# Patient Record
Sex: Male | Born: 1975 | Hispanic: Refuse to answer | Marital: Married
Health system: Midwestern US, Community
[De-identification: ages and names within clinical notes are randomized; demographics above are authoritative.]

## PROBLEM LIST (undated history)

## (undated) DIAGNOSIS — N529 Male erectile dysfunction, unspecified: Principal | ICD-10-CM

---

## 2007-10-03 ENCOUNTER — Emergency Department (HOSPITAL_COMMUNITY): Admission: EM | Admit: 2007-10-03 | Discharge: 2007-10-03 | Payer: Self-pay | Admitting: Family Medicine

## 2007-10-03 ENCOUNTER — Emergency Department (HOSPITAL_COMMUNITY): Admission: EM | Admit: 2007-10-03 | Discharge: 2007-10-03 | Payer: Self-pay | Admitting: Emergency Medicine

## 2010-02-04 ENCOUNTER — Encounter: Admission: RE | Admit: 2010-02-04 | Discharge: 2010-02-04 | Payer: Self-pay | Admitting: Family Medicine

## 2012-07-03 ENCOUNTER — Ambulatory Visit (INDEPENDENT_AMBULATORY_CARE_PROVIDER_SITE_OTHER): Payer: BC Managed Care – PPO | Admitting: Emergency Medicine

## 2012-07-03 ENCOUNTER — Ambulatory Visit: Payer: BC Managed Care – PPO

## 2012-07-03 VITALS — BP 110/80 | HR 62 | Temp 98.1°F | Resp 18 | Ht 72.0 in | Wt 231.0 lb

## 2012-07-03 DIAGNOSIS — M25511 Pain in right shoulder: Secondary | ICD-10-CM

## 2012-07-03 DIAGNOSIS — M542 Cervicalgia: Secondary | ICD-10-CM

## 2012-07-03 DIAGNOSIS — M25519 Pain in unspecified shoulder: Secondary | ICD-10-CM

## 2012-07-03 DIAGNOSIS — R21 Rash and other nonspecific skin eruption: Secondary | ICD-10-CM

## 2012-07-03 LAB — POCT SKIN KOH: Skin KOH, POC: NEGATIVE

## 2012-07-03 MED ORDER — MELOXICAM 15 MG PO TABS: 15.0000 mg | ORAL_TABLET | Freq: Every day | ORAL | Status: DC

## 2012-07-03 MED ORDER — BETAMETHASONE DIPROPIONATE AUG 0.05 % EX CREA: TOPICAL_CREAM | Freq: Two times a day (BID) | CUTANEOUS | Status: DC

## 2012-07-03 MED ORDER — CYCLOBENZAPRINE HCL 10 MG PO TABS: 10.0000 mg | ORAL_TABLET | Freq: Three times a day (TID) | ORAL | Status: DC | PRN

## 2012-07-03 NOTE — Progress Notes (Signed)
  Subjective:    Patient ID: Richard Atkinson, male    DOB: 06-01-1975, 37 y.o.   MRN: [id number]  HPI patient enters with pain in the right side of his neck right upper shoulder with pain on lifting his right arm. He denies being short of breath. He denies being sick. He also would like a rash checked on the inside of his ankle he's used Diprolene cream on this in the past with improvement .    Review of Systems     Objective:   Physical Exam there is tenderness right-sided neck and right shoulder. Rotator cuff cuff testing was normal. Deep tendon reflexes were 2+ and symmetrical. Motor strength 5 out of 5. He does have some mild pain with internal/external rotation of the right shoulder.   [facility] reading (PRIMARY) by  Dr. Cleta Alberts There's no fracture seen. Next number normal. This appears to be a musculoskeletal pain.   Results for orders placed in visit on 07/03/12  POCT SKIN KOH      Result Value Range   Skin KOH, POC Negative          Assessment & Plan:    This appears to be a musculoskeletal pain. Will treat with meloxicam and Flexeril. He was given Diprolene cream to apply to his rash on his ankle which has cleared before with steroid creams. I encouraged him that if his lesion does not completely resolve with treatment he returned to clinic so I can biopsy this area.

## 2012-09-28 ENCOUNTER — Ambulatory Visit (INDEPENDENT_AMBULATORY_CARE_PROVIDER_SITE_OTHER): Payer: BC Managed Care – PPO | Admitting: Family Medicine

## 2012-09-28 ENCOUNTER — Ambulatory Visit: Payer: BC Managed Care – PPO

## 2012-09-28 VITALS — BP 120/80 | HR 71 | Temp 97.7°F | Resp 16 | Ht 72.0 in | Wt 232.0 lb

## 2012-09-28 DIAGNOSIS — M545 Low back pain, unspecified: Secondary | ICD-10-CM

## 2012-09-28 DIAGNOSIS — M25569 Pain in unspecified knee: Secondary | ICD-10-CM

## 2012-09-28 DIAGNOSIS — R21 Rash and other nonspecific skin eruption: Secondary | ICD-10-CM

## 2012-09-28 DIAGNOSIS — M25561 Pain in right knee: Secondary | ICD-10-CM

## 2012-09-28 DIAGNOSIS — L309 Dermatitis, unspecified: Secondary | ICD-10-CM

## 2012-09-28 DIAGNOSIS — L259 Unspecified contact dermatitis, unspecified cause: Secondary | ICD-10-CM

## 2012-09-28 MED ORDER — TRAMADOL HCL 50 MG PO TABS: 50.0000 mg | ORAL_TABLET | Freq: Three times a day (TID) | ORAL | Status: DC | PRN

## 2012-09-28 MED ORDER — BETAMETHASONE DIPROPIONATE AUG 0.05 % EX CREA: TOPICAL_CREAM | Freq: Two times a day (BID) | CUTANEOUS | Status: DC

## 2012-09-28 MED ORDER — OXAPROZIN 600 MG PO TABS: ORAL_TABLET | ORAL | Status: DC

## 2012-09-28 MED ORDER — METHOCARBAMOL 750 MG PO TABS: ORAL_TABLET | ORAL | Status: DC

## 2012-09-28 NOTE — Patient Instructions (Addendum)
Take the methocarbamol, muscle relaxant, one in the morning, one in the afternoon, and 2 at bedtime when needed for tightness and pain in back  Take the oxaprazin one twice daily for pain and inflammation of the knee and of the back  Use the cream twice daily on the ankle. If the rash continues to persist we may need to do a biopsy of the rash.

## 2012-09-28 NOTE — Progress Notes (Signed)
Subjective: Patient has been hurting in the left low back since yesterday. Does not know of any injury. He does carry his boys and play with them.  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  His right knee has been bothering him for a month or two. No defined injury there either. When he walks down the stairs he felt a pop.  Objective: Spine is nontender. No tenderness on the right. Mild tenderness on the left paraspinous muscles. No SI tenderness. No urinary symptoms. His right knee has mild crepitus and no effusion. Ligaments seem strong.  Assessment: Right knee pain Left low back pain, muscular Dermatitis of ankle--if the rash persists we may need to do a punch biopsy of it.  Plan: He wanted to refill the Diprolene which helped him some before.  [facility] reading (PRIMARY) by  Dr. Alwyn Ren Normal knee  Assessment: Knee pain, possibly cartilaginous injury. If symptoms persist we'll need to get a specialist to see this.Marland Kitchen

## 2013-06-22 ENCOUNTER — Ambulatory Visit (INDEPENDENT_AMBULATORY_CARE_PROVIDER_SITE_OTHER): Payer: BC Managed Care – PPO | Admitting: Family Medicine

## 2013-06-22 VITALS — BP 106/68 | HR 89 | Temp 97.3°F | Resp 18 | Ht 72.0 in | Wt 226.8 lb

## 2013-06-22 DIAGNOSIS — M545 Low back pain, unspecified: Secondary | ICD-10-CM

## 2013-06-22 DIAGNOSIS — L309 Dermatitis, unspecified: Secondary | ICD-10-CM

## 2013-06-22 DIAGNOSIS — L259 Unspecified contact dermatitis, unspecified cause: Secondary | ICD-10-CM

## 2013-06-22 MED ORDER — METHOCARBAMOL 750 MG PO TABS: ORAL_TABLET | ORAL | Status: DC

## 2013-06-22 MED ORDER — CLOBETASOL PROPIONATE 0.05 % EX OINT: 1.0000 "application " | TOPICAL_OINTMENT | Freq: Two times a day (BID) | CUTANEOUS | Status: DC

## 2013-06-22 MED ORDER — TRAMADOL HCL 50 MG PO TABS: 50.0000 mg | ORAL_TABLET | Freq: Three times a day (TID) | ORAL | Status: DC | PRN

## 2013-06-22 MED ORDER — OXAPROZIN 600 MG PO TABS: ORAL_TABLET | ORAL | Status: DC

## 2013-06-22 NOTE — Patient Instructions (Addendum)
Take tramadol one every 6 or 8 hours only when needed for severe pain  Take Daypro (oxzatoprin) daily with food for pain and inflammation. Take this every day.  Take methocarbamol one in the morning, one in the afternoon, and 2 at bedtime for muscle relaxant  Avoid heavy lifting and/or straining.  Return if not improving  Work on weight loss

## 2013-06-22 NOTE — Progress Notes (Signed)
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 He has had bad left low back pain since yesterday. He does not know what he did differently, but maybe lifting some things strained it. He is hurting on his left low back when he turns certain positions. He had done well since I treated him for this last summer. His rash on his ankle started doing better but his return. He says he did not faithfully use the medication. Otherwise he is doing fairly well. They have a 613-week-old child.  Objective: No acute distress. Spine is nontender. No CVA tenderness. When he bends forward there is pain in the left low side. Left lateral tilt is good. When he tilts to the right starts racing felt it hurts a lot. He has not tender superficially, but since the pain. Straight leg raise is negative.  3 inch diameter patch of thick scaly skin on the ankle.  Assessment: Low back pain Dermatitis  Plan: Clobetasol ointment Robaxin, Daypro, and tramadol again for his back since it worked before. I will refill this one time if needed. Return if not improving. If the rash persists we will need to see him back and do biopsy of it.

## 2014-01-17 ENCOUNTER — Ambulatory Visit (INDEPENDENT_AMBULATORY_CARE_PROVIDER_SITE_OTHER): Payer: BC Managed Care – PPO | Admitting: Emergency Medicine

## 2014-01-17 VITALS — BP 120/80 | HR 77 | Temp 98.2°F | Resp 16 | Ht 71.75 in | Wt 231.0 lb

## 2014-01-17 DIAGNOSIS — Z0000 Encounter for general adult medical examination without abnormal findings: Secondary | ICD-10-CM

## 2014-01-17 LAB — POCT CBC
Granulocyte percent: 67.5 %G (ref 37–80)
HCT, POC: 51.1 % (ref 43.5–53.7)
Hemoglobin: 17.2 g/dL (ref 14.1–18.1)
Lymph, poc: 2.3 (ref 0.6–3.4)
MCH, POC: 30.9 pg (ref 27–31.2)
MCHC: 33.7 g/dL (ref 31.8–35.4)
MCV: 91.5 fL (ref 80–97)
MID (cbc): 0.4 (ref 0–0.9)
MPV: 7.3 fL (ref 0–99.8)
POC Granulocyte: 5.6 (ref 2–6.9)
POC LYMPH PERCENT: 28 %L (ref 10–50)
POC MID %: 4.5 %M (ref 0–12)
Platelet Count, POC: 261 10*3/uL (ref 142–424)
RBC: 5.59 M/uL (ref 4.69–6.13)
RDW, POC: 13.1 %
WBC: 8.3 10*3/uL (ref 4.6–10.2)

## 2014-01-17 LAB — LIPID PANEL
Cholesterol: 168 mg/dL (ref 0–200)
HDL: 37 mg/dL — ABNORMAL LOW (ref >39)
LDL Cholesterol: 112 mg/dL — ABNORMAL HIGH (ref 0–99)
Total CHOL/HDL Ratio: 4.5 Ratio
Triglycerides: 96 mg/dL (ref <150)
VLDL: 19 mg/dL (ref 0–40)

## 2014-01-17 LAB — COMPREHENSIVE METABOLIC PANEL
ALT: 12 U/L (ref 0–53)
AST: 20 U/L (ref 0–37)
Albumin: 4.7 g/dL (ref 3.5–5.2)
Alkaline Phosphatase: 65 U/L (ref 39–117)
BUN: 12 mg/dL (ref 6–23)
CO2: 29 mEq/L (ref 19–32)
Calcium: 9.6 mg/dL (ref 8.4–10.5)
Chloride: 100 mEq/L (ref 96–112)
Creat: 0.8 mg/dL (ref 0.50–1.35)
Glucose, Bld: 95 mg/dL (ref 70–99)
Potassium: 4.1 mEq/L (ref 3.5–5.3)
Sodium: 138 mEq/L (ref 135–145)
Total Bilirubin: 1 mg/dL (ref 0.2–1.2)
Total Protein: 7.5 g/dL (ref 6.0–8.3)

## 2014-01-17 LAB — POCT URINALYSIS DIPSTICK
Bilirubin, UA: NEGATIVE
Blood, UA: NEGATIVE
Glucose, UA: NEGATIVE
Ketones, UA: NEGATIVE
Leukocytes, UA: NEGATIVE
Nitrite, UA: NEGATIVE
Protein, UA: NEGATIVE
Spec Grav, UA: >=1.03
Urobilinogen, UA: 0.2
pH, UA: 5

## 2014-01-17 LAB — POCT UA - MICROSCOPIC ONLY
Bacteria, U Microscopic: NEGATIVE
Casts, Ur, LPF, POC: NEGATIVE
Crystals, Ur, HPF, POC: NEGATIVE
Epithelial cells, urine per micros: NEGATIVE
Mucus, UA: NEGATIVE
WBC, Ur, HPF, POC: NEGATIVE
Yeast, UA: NEGATIVE

## 2014-01-17 LAB — POCT GLYCOSYLATED HEMOGLOBIN (HGB A1C): Hemoglobin A1C: 5.5

## 2014-01-17 LAB — TSH: TSH: 0.978 u[IU]/mL (ref 0.350–4.500)

## 2014-01-17 NOTE — Progress Notes (Signed)
[facility] [address], [place] [address] [phone]  Date:  01/17/2014   Name:  Richard Atkinson   DOB:  01/29/1976   MRN:  [id number]  PCP:  No PCP Per Patient    Chief Complaint: Annual Exam   History of Present Illness:  Richard Atkinson is a 38 y.o. very pleasant male patient who presents with the following:  For wellness examination.  Non smoker. No medications.  No allergies. Flu shot at work. Denies other complaint or health concern today.   There are no active problems to display for this patient.   History reviewed. No pertinent past medical history.  History reviewed. No pertinent past surgical history.  History  Substance Use Topics  . Smoking status: Never Smoker   . Smokeless tobacco: Not on file  . Alcohol Use: No    History reviewed. No pertinent family history.  No Known Allergies  Medication list has been reviewed and updated.  Current Outpatient Prescriptions on File Prior to Visit  Medication Sig Dispense Refill  . augmented betamethasone dipropionate (DIPROLENE AF) 0.05 % cream Apply topically 2 (two) times daily.  60 g  0  . clobetasol ointment (TEMOVATE) 0.05 % Apply 1 application topically 2 (two) times daily.  30 g  0  . meloxicam (MOBIC) 15 MG tablet Take 1 tablet (15 mg total) by mouth daily.  30 tablet  1  . methocarbamol (ROBAXIN-750) 750 MG tablet Take one in the morning, one in the afternoon, and 2 at bedtime as needed for muscle relaxant.  40 tablet  0  . oxaprozin (DAYPRO) 600 MG tablet Take one twice daily with food for back pain  30 tablet  1  . traMADol (ULTRAM) 50 MG tablet Take 1 tablet (50 mg total) by mouth every 8 (eight) hours as needed.  30 tablet  0   No current facility-administered medications on file prior to visit.    Review of Systems:  As per HPI, otherwise negative.    Physical Examination: Filed Vitals:   01/17/14 0832  BP: 120/80  Pulse: 77  Temp: 98.2 F (36.8 C)  Resp: 16    Filed Vitals:   01/17/14 0832  Height: 5' 11.75" (1.822 m)  Weight: 231 lb (104.781 kg)   Body mass index is 31.56 kg/(m^2). Ideal Body Weight: Weight in (lb) to have BMI = 25: 182.7  GEN: WDWN, NAD, Non-toxic, A & O x 3 HEENT: Atraumatic, Normocephalic. Neck supple. No masses, No LAD. Ears and Nose: No external deformity. CV: RRR, No M/G/R. No JVD. No thrill. No extra heart sounds. PULM: CTA B, no wheezes, crackles, rhonchi. No retractions. No resp. distress. No accessory muscle use. ABD: S, NT, ND, +BS. No rebound. No HSM. EXTR: No c/c/e NEURO Normal gait.  PSYCH: Normally interactive. Conversant. Not depressed or anxious appearing.  Calm demeanor.    Assessment and Plan: Wellness examination Labs pending  Signed,  Phillips OdorJeffery Anderson, MD

## 2014-04-23 ENCOUNTER — Ambulatory Visit (INDEPENDENT_AMBULATORY_CARE_PROVIDER_SITE_OTHER): Payer: BLUE CROSS/BLUE SHIELD | Admitting: Physician Assistant

## 2014-04-23 DIAGNOSIS — M545 Low back pain, unspecified: Secondary | ICD-10-CM

## 2014-04-23 DIAGNOSIS — M48061 Spinal stenosis, lumbar region without neurogenic claudication: Secondary | ICD-10-CM

## 2014-04-23 DIAGNOSIS — M4806 Spinal stenosis, lumbar region: Secondary | ICD-10-CM

## 2014-04-23 MED ORDER — MELOXICAM 15 MG PO TABS: 15.0000 mg | ORAL_TABLET | Freq: Every day | ORAL | Status: DC

## 2014-04-23 MED ORDER — METHOCARBAMOL 750 MG PO TABS: ORAL_TABLET | ORAL | Status: DC

## 2014-04-23 NOTE — Progress Notes (Signed)
IDENTIFYING INFORMATION  Richard Atkinson / DOB: 04/30/1975 / MRN: [id number]  The patient has Spinal stenosis of lumbar region on his problem list.  SUBJECTIVE  CC: Medication Refill   HPI: Richard Atkinson is a 39 y.o. y.o. male presenting for medication refill.  He has a long history of back pain, and was diagnosed with mild multifaceted lumbar stenosis and Schmorl's nodes at multiple lumbar levels via MRI in October of 2011.   He is not having pain at this time. However, when he has pain he reports that his lumbar back pain is left sided and he describes it as a sever dull ache.  He is unable to work with pain.  When these episodes occure he will take two to three days of 15 mg Mobic along with Robaxin during the resting hours and his pain will be better for a few months.  He will then invariably hurt his back again and will repeat the treatment course.  He denies loss of bowel or bladder.  He denies weakness, paraesthesia, and pain radiating to the lower extremity.    He  has no past medical history on file.    He has a current medication list which includes the following prescription(s): methocarbamol, oxaprozin, tramadol, augmented betamethasone dipropionate, clobetasol ointment, and meloxicam.  Richard Atkinson has No Known Allergies. He  reports that he has never smoked. He does not have any smokeless tobacco history on file. He reports that he does not drink alcohol or use illicit drugs. He  reports that he currently engages in sexual activity.  The patient  has no past surgical history on file.  His family history is not on file.  Review of Systems  Constitutional: Negative.   Gastrointestinal: Negative.   Genitourinary: Negative.   Musculoskeletal: Positive for myalgias and back pain. Negative for joint pain, falls and neck pain.  Neurological: Negative.     OBJECTIVE  Blood pressure 118/72, pulse 71, temperature 97.1 F (36.2 C), temperature source Oral, resp. rate 18, height  6' 0.5" (1.842 m), weight 235 lb 12.8 oz (106.958 kg), SpO2 98 %. The patient's body mass index is 31.52 kg/(m^2).  Physical Exam  Constitutional: He is oriented to person, place, and time. He appears well-developed and well-nourished.  Musculoskeletal:       Lumbar back: He exhibits normal range of motion, no tenderness, no swelling, no edema, no deformity, no pain and no spasm.       Back:  Neurological: He is alert and oriented to person, place, and time. He has normal strength and normal reflexes. He displays normal reflexes. No sensory deficit. He exhibits normal muscle tone. Coordination normal.  Skin: Skin is warm and dry.  Psychiatric: He has a normal mood and affect. His behavior is normal. Judgment and thought content normal.    No results found for this or any previous visit (from the past 24 hour(s)).  ASSESSMENT & PLAN  Loney LaurenceYouness was seen today for medication refill.  Diagnoses and associated orders for this visit:  Spinal stenosis of lumbar region  Acute low back pain - meloxicam (MOBIC) 15 MG tablet; Take 1 tablet (15 mg total) by mouth daily. - methocarbamol (ROBAXIN-750) 750 MG tablet; Take one in the morning, one in the afternoon, and 2 at bedtime as needed for muscle relaxant.     The patient was instructed to to call or comeback to clinic as needed, or should symptoms warrant.  Deliah BostonMichael Clark, MHS, PA-C [facility]  [facility] 04/23/2014 3:46 PM

## 2015-05-15 ENCOUNTER — Ambulatory Visit (INDEPENDENT_AMBULATORY_CARE_PROVIDER_SITE_OTHER): Payer: Self-pay | Admitting: Urgent Care

## 2015-05-15 VITALS — BP 110/72 | HR 100 | Temp 98.0°F | Resp 16 | Ht 72.0 in | Wt 228.0 lb

## 2015-05-15 DIAGNOSIS — M48061 Spinal stenosis, lumbar region without neurogenic claudication: Secondary | ICD-10-CM

## 2015-05-15 DIAGNOSIS — R21 Rash and other nonspecific skin eruption: Secondary | ICD-10-CM

## 2015-05-15 DIAGNOSIS — M545 Low back pain, unspecified: Secondary | ICD-10-CM

## 2015-05-15 DIAGNOSIS — J029 Acute pharyngitis, unspecified: Secondary | ICD-10-CM

## 2015-05-15 DIAGNOSIS — M4806 Spinal stenosis, lumbar region: Secondary | ICD-10-CM

## 2015-05-15 MED ORDER — AMOXICILLIN 875 MG PO TABS: 875.0000 mg | ORAL_TABLET | Freq: Two times a day (BID) | ORAL | Status: DC

## 2015-05-15 MED ORDER — CLOBETASOL PROPIONATE 0.05 % EX CREA: 1.0000 "application " | TOPICAL_CREAM | Freq: Two times a day (BID) | CUTANEOUS | Status: AC

## 2015-05-15 MED ORDER — HYDROCODONE-HOMATROPINE 5-1.5 MG/5ML PO SYRP
5.0000 mL | ORAL_SOLUTION | Freq: Every evening | ORAL | Status: DC | PRN
Start: 2015-05-15 — End: 2015-06-19

## 2015-05-15 MED ORDER — METHOCARBAMOL 750 MG PO TABS: ORAL_TABLET | ORAL | Status: DC

## 2015-05-15 MED ORDER — MELOXICAM 15 MG PO TABS: 15.0000 mg | ORAL_TABLET | Freq: Every day | ORAL | Status: DC

## 2015-05-15 NOTE — Patient Instructions (Signed)

## 2015-05-15 NOTE — Progress Notes (Signed)
    MRN: [id number] DOB: 1976-03-19  Subjective:   Richard Atkinson is a 40 y.o. male presenting for chief complaint of Sore Throat; Cough; and Sinusitis  Sore Throat - Reports 3 day history of worsening sore throat, subjective fever, productive cough, cough has produced emesis twice. Has tried ibuprofen with relief of fever, chloraseptic with minimal relief. Denies chest pain, shob, wheezing, sinus pain, ear pain, ear drainage, abdominal pain.  Back pain - has longstanding history of intermittent back pain. Does manual labor, lifts very heavy boxes. Has also had difficulty with muscle spasms. He was diagnosed with mild multifaceted lumbar stenosis and Schmorl's nodes at multiple lumbar levels via MRI in October of 2011. Back pain and spasms relieved with meloxicam and Robaxin. He would like to refill both medications.  Rash - reports longstanding history of intermittent itchy rash over his ankles. He has tried multiple steroid creams for this. He has a recurrence today and would like to refill clobetasol which has helped the most.    [name] currently has no medications in their medication list. Also has No Known Allergies.  [name]  has no past medical history on file. Also  has no past surgical history on file.  Objective:   Vitals: BP 110/72 mmHg  Pulse 100  Temp(Src) 98 F (36.7 C) (Oral)  Resp 16  Ht 6' (1.829 m)  Wt 228 lb (103.42 kg)  BMI 30.92 kg/m2  SpO2 98%  Physical Exam  Constitutional: He is oriented to person, place, and time. He appears well-developed and well-nourished.  HENT:  Mouth/Throat: Oropharyngeal exudate (right-sided with associated erythematous, enlarged tonsil) present.  Eyes: Right eye exhibits no discharge. Left eye exhibits no discharge. No scleral icterus.  Neck: Normal range of motion. Neck supple.  Cardiovascular: Normal rate, regular rhythm and intact distal pulses.  Exam reveals no gallop and no friction rub.   No murmur heard. Pulmonary/Chest:  No respiratory distress. He has no wheezes. He has no rales.  Musculoskeletal: He exhibits no edema or tenderness.  Lymphadenopathy:    He has cervical adenopathy (right-sided, anterior).  Neurological: He is alert and oriented to person, place, and time.  Skin: Skin is warm and dry. Rash (scaly, silvery plaque rash over medial left ankle ~3cm in diameter) noted. No erythema. No pallor.   Assessment and Plan :   1. Pharyngitis - Start amoxicillin for empiric treatment of strep throat. Hycodan and ibuprofen for sore throat. RTC in 1 week if no improvement.  2. Spinal stenosis of lumbar region 3. Acute low back pain - Stable, refilled meloxicam and Robaxin.   4. 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 RTC in 1 week if no improvement.  Wallis Bamberg, PA-C [facility] [phone] 05/15/2015 1:40 PM

## 2015-06-17 DIAGNOSIS — T148 Other injury of unspecified body region: Secondary | ICD-10-CM | POA: Insufficient documentation

## 2015-06-17 DIAGNOSIS — Y998 Other external cause status: Secondary | ICD-10-CM | POA: Insufficient documentation

## 2015-06-17 DIAGNOSIS — S3991XA Unspecified injury of abdomen, initial encounter: Secondary | ICD-10-CM | POA: Insufficient documentation

## 2015-06-17 DIAGNOSIS — Y92410 Unspecified street and highway as the place of occurrence of the external cause: Secondary | ICD-10-CM | POA: Insufficient documentation

## 2015-06-17 DIAGNOSIS — S3992XA Unspecified injury of lower back, initial encounter: Secondary | ICD-10-CM | POA: Insufficient documentation

## 2015-06-17 DIAGNOSIS — S161XXA Strain of muscle, fascia and tendon at neck level, initial encounter: Secondary | ICD-10-CM | POA: Insufficient documentation

## 2015-06-17 DIAGNOSIS — S79921A Unspecified injury of right thigh, initial encounter: Secondary | ICD-10-CM | POA: Insufficient documentation

## 2015-06-17 DIAGNOSIS — Y9389 Activity, other specified: Secondary | ICD-10-CM | POA: Insufficient documentation

## 2015-06-18 ENCOUNTER — Emergency Department (HOSPITAL_COMMUNITY)
Admission: EM | Admit: 2015-06-18 | Discharge: 2015-06-18 | Disposition: A | Discharge status: Home / Self Care | Payer: Self-pay | Attending: Emergency Medicine | Admitting: Emergency Medicine

## 2015-06-18 ENCOUNTER — Emergency Department (HOSPITAL_COMMUNITY): Payer: No Typology Code available for payment source

## 2015-06-18 ENCOUNTER — Encounter (HOSPITAL_COMMUNITY): Payer: Self-pay | Admitting: Emergency Medicine

## 2015-06-18 ENCOUNTER — Emergency Department (HOSPITAL_COMMUNITY): Payer: Self-pay

## 2015-06-18 DIAGNOSIS — S161XXA Strain of muscle, fascia and tendon at neck level, initial encounter: Secondary | ICD-10-CM

## 2015-06-18 DIAGNOSIS — T148XXA Other injury of unspecified body region, initial encounter: Secondary | ICD-10-CM

## 2015-06-18 LAB — CBC WITH DIFFERENTIAL/PLATELET
Basophils Absolute: 0 10*3/uL (ref 0.0–0.1)
Basophils Relative: 0 %
Eosinophils Absolute: 0.6 10*3/uL (ref 0.0–0.7)
Eosinophils Relative: 7 %
HEMATOCRIT: 47.2 % (ref 39.0–52.0)
Hemoglobin: 16.3 g/dL (ref 13.0–17.0)
LYMPHS ABS: 2.2 10*3/uL (ref 0.7–4.0)
LYMPHS PCT: 25 %
MCH: 31.4 pg (ref 26.0–34.0)
MCHC: 34.5 g/dL (ref 30.0–36.0)
MCV: 90.9 fL (ref 78.0–100.0)
Monocytes Absolute: 0.5 10*3/uL (ref 0.1–1.0)
Monocytes Relative: 6 %
Neutro Abs: 5.5 10*3/uL (ref 1.7–7.7)
Neutrophils Relative %: 62 %
Platelets: 212 10*3/uL (ref 150–400)
RBC: 5.19 MIL/uL (ref 4.22–5.81)
RDW: 12.8 % (ref 11.5–15.5)
WBC: 8.8 10*3/uL (ref 4.0–10.5)

## 2015-06-18 LAB — COMPREHENSIVE METABOLIC PANEL
ALT: 12 U/L — ABNORMAL LOW (ref 17–63)
AST: 28 U/L (ref 15–41)
Albumin: 4.2 g/dL (ref 3.5–5.0)
Alkaline Phosphatase: 59 U/L (ref 38–126)
Anion gap: 8 (ref 5–15)
BILIRUBIN TOTAL: 0.8 mg/dL (ref 0.3–1.2)
BUN: 12 mg/dL (ref 6–20)
CO2: 27 mmol/L (ref 22–32)
Calcium: 9.1 mg/dL (ref 8.9–10.3)
Chloride: 105 mmol/L (ref 101–111)
Creatinine, Ser: 0.75 mg/dL (ref 0.61–1.24)
GFR calc Af Amer: >60 mL/min (ref >60)
GFR calc non Af Amer: >60 mL/min (ref >60)
GLUCOSE: 110 mg/dL — AB (ref 65–99)
Potassium: 3.9 mmol/L (ref 3.5–5.1)
Sodium: 140 mmol/L (ref 135–145)
Total Protein: 7.4 g/dL (ref 6.5–8.1)

## 2015-06-18 MED ORDER — OXYCODONE-ACETAMINOPHEN 5-325 MG PO TABS
1.0000 | ORAL_TABLET | Freq: Once | ORAL | Status: AC
  Administered 2015-06-18: 1 via ORAL
  Filled 2015-06-18: qty 1

## 2015-06-18 MED ORDER — IBUPROFEN 600 MG PO TABS: 600.0000 mg | ORAL_TABLET | Freq: Four times a day (QID) | ORAL | Status: DC | PRN

## 2015-06-18 MED ORDER — IOHEXOL 300 MG/ML  SOLN
100.0000 mL | Freq: Once | INTRAMUSCULAR | Status: AC | PRN
  Administered 2015-06-18: 100 mL via INTRAVENOUS

## 2015-06-18 MED ORDER — METHOCARBAMOL 500 MG PO TABS: 500.0000 mg | ORAL_TABLET | Freq: Two times a day (BID) | ORAL | Status: DC

## 2015-06-18 MED ORDER — MORPHINE SULFATE (PF) 4 MG/ML IV SOLN
4.0000 mg | Freq: Once | INTRAVENOUS | Status: AC
  Administered 2015-06-18: 4 mg via INTRAVENOUS
  Filled 2015-06-18: qty 1

## 2015-06-18 MED ORDER — HYDROCODONE-ACETAMINOPHEN 5-325 MG PO TABS: 1.0000 | ORAL_TABLET | Freq: Four times a day (QID) | ORAL | Status: DC | PRN

## 2015-06-18 MED ORDER — NAPROXEN 500 MG PO TABS
500.0000 mg | ORAL_TABLET | Freq: Once | ORAL | Status: AC
  Administered 2015-06-18: 500 mg via ORAL
  Filled 2015-06-18: qty 1

## 2015-06-18 MED ORDER — HYDROCODONE-ACETAMINOPHEN 5-325 MG PO TABS
1.0000 | ORAL_TABLET | Freq: Once | ORAL | Status: AC
  Administered 2015-06-18: 1 via ORAL
  Filled 2015-06-18: qty 1

## 2015-06-18 NOTE — ED Notes (Signed)
Discharge instructions, follow up care, and rx x3 reviewed with patient. Patient verbalized understanding. 
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
## 2015-06-19 ENCOUNTER — Emergency Department (HOSPITAL_COMMUNITY): Payer: No Typology Code available for payment source

## 2015-06-19 ENCOUNTER — Encounter (HOSPITAL_COMMUNITY): Payer: Self-pay | Admitting: Emergency Medicine

## 2015-06-19 ENCOUNTER — Emergency Department (HOSPITAL_COMMUNITY)
Admission: EM | Admit: 2015-06-19 | Discharge: 2015-06-19 | Disposition: A | Discharge status: Home / Self Care | Payer: No Typology Code available for payment source | Attending: Emergency Medicine | Admitting: Emergency Medicine

## 2015-06-19 DIAGNOSIS — S3992XD Unspecified injury of lower back, subsequent encounter: Secondary | ICD-10-CM | POA: Diagnosis present

## 2015-06-19 DIAGNOSIS — M545 Low back pain, unspecified: Secondary | ICD-10-CM

## 2015-06-19 DIAGNOSIS — S199XXD Unspecified injury of neck, subsequent encounter: Secondary | ICD-10-CM | POA: Diagnosis not present

## 2015-06-19 DIAGNOSIS — Z79899 Other long term (current) drug therapy: Secondary | ICD-10-CM | POA: Insufficient documentation

## 2015-06-19 DIAGNOSIS — S8991XD Unspecified injury of right lower leg, subsequent encounter: Secondary | ICD-10-CM | POA: Diagnosis not present

## 2015-06-19 DIAGNOSIS — M25561 Pain in right knee: Secondary | ICD-10-CM

## 2015-06-19 MED ORDER — METHOCARBAMOL 500 MG PO TABS
1000.0000 mg | ORAL_TABLET | Freq: Once | ORAL | Status: AC
  Administered 2015-06-19: 1000 mg via ORAL
  Filled 2015-06-19: qty 2

## 2015-06-19 MED ORDER — LIDOCAINE 5 % EX PTCH
1.0000 | MEDICATED_PATCH | CUTANEOUS | Status: DC
  Administered 2015-06-19: 1 via TRANSDERMAL
  Filled 2015-06-19: qty 1

## 2015-06-19 MED ORDER — KETOROLAC TROMETHAMINE 60 MG/2ML IM SOLN
60.0000 mg | Freq: Once | INTRAMUSCULAR | Status: AC
  Administered 2015-06-19: 60 mg via INTRAMUSCULAR
  Filled 2015-06-19: qty 2

## 2015-06-19 MED ORDER — OXYCODONE-ACETAMINOPHEN 5-325 MG PO TABS
1.0000 | ORAL_TABLET | Freq: Once | ORAL | Status: AC
  Administered 2015-06-19: 1 via ORAL
  Filled 2015-06-19: qty 1

## 2015-06-19 NOTE — ED Notes (Signed)
PA at bedside.

## 2015-06-19 NOTE — ED Notes (Signed)
Patient transported to X-ray 
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
## 2015-06-19 NOTE — Discharge Instructions (Signed)
You have been seen today for leg pain and back pain. Your imaging showed no abnormalities. Follow-up with orthopedics should your knee pain continue. Follow up with PCP as needed for chronic management of your pain. Return to ED should symptoms worsen.   Emergency Department Resource Guide 1) Find a Doctor and Pay Out of Pocket Although you won't have to find out who is covered by your insurance plan, it is a good idea to ask around and get recommendations. You will then need to call the office and see if the doctor you have chosen will accept you as a new patient and what types of options they offer for patients who are self-pay. Some doctors offer discounts or will set up payment plans for their patients who do not have insurance, but you will need to ask so you aren't surprised when you get to your appointment.  2) Contact Your Local Health Department Not all health departments have doctors that can see patients for sick visits, but many do, so it is worth a call to see if yours does. If you don't know where your local health department is, you can check in your phone book. The CDC also has a tool to help you locate your state's health department, and many state websites also have listings of all of their local health departments.  3) Find a Walk-in Clinic If your illness is not likely to be very severe or complicated, you may want to try a walk in clinic. These are popping up all over the country in pharmacies, drugstores, and shopping centers. They're usually staffed by nurse practitioners or physician assistants that have been trained to treat common illnesses and complaints. They're usually fairly quick and inexpensive. However, if you have serious medical issues or chronic medical problems, these are probably not your best option.  No Primary Care Doctor: - Call [facility] at  [phone] - they can help you locate a primary care doctor that  accepts your insurance, provides certain services,  etc. - Physician Referral Service- [phone]  Chronic Pain Problems: Organization         Address  Phone   Notes  [facility]  [phone] Patients need to be referred by their primary care doctor.   Medication Assistance: Organization         Address  Phone   Notes  [facility] [address] [place], [place] [address] [phone] --Must be a resident of [facility] -- Must have NO insurance coverage whatsoever (no Medicaid/ Medicare, etc.) -- The pt. MUST have a primary care doctor that directs their care regularly and follows them in the community   [facility]  [phone]   [employer]  [phone]    Agencies that provide inexpensive medical care: Organization         Address  Phone   Notes  [facility]  [phone]   [facility]    [phone]   [facility] [address] [place], [place] [address] [phone]   [facility] [address], [place] [phone]   [facility]    [phone]   [facility]    [phone]   [facility]  [address], [place] Phone:  [phone], Fax:  [phone] Hours of Operation:  9 am - 6 pm, M-F.  Also accepts  Medicaid/Medicare and self-pay.  [facility]  [address], [place] Phone: [phone], Fax: [phone]. Hours of Operation:  8:30 am - 5:30 pm, M-F.  Also accepts Medicaid and self-pay.  [facility] [place] [address] [place] Phone: [phone]   [facility] [address] Natasha Bence [place], [place] [phone], Ext. [phone] Mondays & Thursdays: 7-9 AM.  First 15 patients are seen on a first come, first serve basis.    Medicaid-accepting [facility] Providers:  Organization         Address  Phone   Notes  [facility] [address], [place] [phone] Also accepts self-pay patients.  [facility] [address] Laurell Josephs [place], [place]  [phone]   [facility] [address], [place] [phone]   [facility] [address], [place] [phone]   Renaye Rakers [address], [place]   [phone] Only accepts [place] patients after they have their name applied to their card.   Self-Pay (no insurance) in [facility]:  Organization         Address  Phone   Notes  Sickle Cell Patients, [facility] [address] [place], [place] [phone]   [facility] [address] [place], [place] [phone]   [facility] [place]  [address], [place] [phone]   [facility]/Dr. Osei-Bonsu  [address] [place] or [address], [place] [phone] Phone number for both [place] and [place] locations is the same.  [facility] [address] [place] [phone]   [facility] [address] [place] or [address] [phone]   [facility] [address], [place] [phone], phone; [phone], fax Sees patients 1st and 3rd Saturday of every month.  Must not qualify for public or private insurance (i.e. Medicaid, Medicare,  Health Choice, Veterans' Benefits)  Household income should be no more than 200% of the poverty level The clinic cannot treat you if you are pregnant or think you are pregnant  Sexually transmitted diseases are not treated at the clinic.
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
## 2016-11-09 ENCOUNTER — Encounter: Payer: Self-pay | Admitting: Physician Assistant

## 2016-11-09 ENCOUNTER — Ambulatory Visit (INDEPENDENT_AMBULATORY_CARE_PROVIDER_SITE_OTHER): Payer: Self-pay | Admitting: Physician Assistant

## 2016-11-09 VITALS — BP 113/77 | HR 75 | Temp 98.4°F | Resp 17 | Ht 72.5 in | Wt 232.0 lb

## 2016-11-09 DIAGNOSIS — G8929 Other chronic pain: Secondary | ICD-10-CM

## 2016-11-09 DIAGNOSIS — M545 Low back pain, unspecified: Secondary | ICD-10-CM

## 2016-11-09 DIAGNOSIS — M48061 Spinal stenosis, lumbar region without neurogenic claudication: Secondary | ICD-10-CM

## 2016-11-09 MED ORDER — MELOXICAM 15 MG PO TABS: 15.0000 mg | ORAL_TABLET | Freq: Every day | ORAL | 1 refills | Status: DC

## 2016-11-09 MED ORDER — METHOCARBAMOL 500 MG PO TABS: 500.0000 mg | ORAL_TABLET | Freq: Two times a day (BID) | ORAL | 1 refills | Status: DC

## 2016-11-09 MED ORDER — HYDROCODONE-ACETAMINOPHEN 5-325 MG PO TABS: 1.0000 | ORAL_TABLET | Freq: Four times a day (QID) | ORAL | 0 refills | Status: DC | PRN

## 2016-11-09 MED ORDER — IBUPROFEN 600 MG PO TABS: 600.0000 mg | ORAL_TABLET | Freq: Four times a day (QID) | ORAL | 4 refills | Status: DC | PRN

## 2016-11-09 NOTE — Patient Instructions (Addendum)
Please see below instructions to help relieve your back pain. Start stretching/ exercising on a regular basis - do this 5-7 days/weeks. Incorporate a regular exercise routine like walking or swimming.  Massages will help loosen your muscles.  Stay well hydrated - drink 2-3 liters of water daily to help keep your muscles lubricated.  Come back or call if you would like a referral to a specialist.  Acupuncture helps some people with chronic back pain ([place] or [place] are good ones).  For a brace you can go to [facility] [address], [place] [phone]  Thank you for coming in today. I hope you feel we met your needs.  Feel free to call PCP if you have any questions or further requests.  Please consider signing up for MyChart if you do not already have it, as this is a great way to communicate with me.  Best,  Whitney McVey, PA-C   Chronic Back Pain When back pain lasts longer than 3 months, it is called chronic back pain.The cause of your back pain may not be known. Some common causes include:  Wear and tear (degenerative disease) of the bones, ligaments, or disks in your back.  Inflammation and stiffness in your back (arthritis).  People who have chronic back pain often go through certain periods in which the pain is more intense (flare-ups). Many people can learn to manage the pain with home care. Follow these instructions at home: Pay attention to any changes in your symptoms. Take these actions to help with your pain: Activity  Avoid bending and activities that make the problem worse.  Do not sit or stand in one place for long periods of time.  Take brief periods of rest throughout the day. This will reduce your pain. Resting in a lying or standing position is usually better than sitting to rest.  When you are resting for longer periods, mix in some mild activity or stretching between periods of rest. This will help to prevent stiffness and  pain.  Get regular exercise. Ask your health care provider what activities are safe for you.  Do not lift anything that is heavier than 10 lb (4.5 kg). Always use proper lifting technique, which includes: ? Bending your knees. ? Keeping the load close to your body. ? Avoiding twisting. Managing pain  If directed, apply ice to the painful area. Your health care provider may recommend applying ice during the first 24-48 hours after a flare-up begins. ? Put ice in a plastic bag. ? Place a towel between your skin and the bag. ? Leave the ice on for 20 minutes, 2-3 times per day.  After icing, apply heat to the affected area as often as told by your health care provider. Use the heat source that your health care provider recommends, such as a moist heat pack or a heating pad. ? Place a towel between your skin and the heat source. ? Leave the heat on for 20-30 minutes. ? Remove the heat if your skin turns bright red. This is especially important if you are unable to feel pain, heat, or cold. You may have a greater risk of getting burned.  Try soaking in a warm tub.  Take over-the-counter and prescription medicines only as told by your health care provider.  Keep all follow-up visits as told by your health care provider. This is important. Contact a health care provider if:  You have pain that is not relieved with rest or  medicine. Get help right away if:  You have weakness or numbness in one or both of your legs or feet.  You have trouble controlling your bladder or your bowels.  You have nausea or vomiting.  You have pain in your abdomen.  You have shortness of breath or you faint. This information is not intended to replace advice given to you by your health care provider. Make sure you discuss any questions you have with your health care provider. Document Released: 05/13/2004 Document Revised: 08/14/2015 Document Reviewed: 09/23/2014 Elsevier Interactive Patient Education   2018 [employer].   Back Exercises The following exercises strengthen the muscles that help to support the back. They also help to keep the lower back flexible. Doing these exercises can help to prevent back pain or lessen existing pain. If you have back pain or discomfort, try doing these exercises 2-3 times each day or as told by your health care provider. When the pain goes away, do them once each day, but increase the number of times that you repeat the steps for each exercise (do more repetitions). If you do not have back pain or discomfort, do these exercises once each day or as told by your health care provider. Exercises Single Knee to Chest  Repeat these steps 3-5 times for each leg: 1. Lie on your back on a firm bed or the floor with your legs extended. 2. Bring one knee to your chest. Your other leg should stay extended and in contact with the floor. 3. Hold your knee in place by grabbing your knee or thigh. 4. Pull on your knee until you feel a gentle stretch in your lower back. 5. Hold the stretch for 10-30 seconds. 6. Slowly release and straighten your leg.  Pelvic Tilt  Repeat these steps 5-10 times: 1. Lie on your back on a firm bed or the floor with your legs extended. 2. Bend your knees so they are pointing toward the ceiling and your feet are flat on the floor. 3. Tighten your lower abdominal muscles to press your lower back against the floor. This motion will tilt your pelvis so your tailbone points up toward the ceiling instead of pointing to your feet or the floor. 4. With gentle tension and even breathing, hold this position for 5-10 seconds.  Cat-Cow  Repeat these steps until your lower back becomes more flexible: 1. Get into a hands-and-knees position on a firm surface. Keep your hands under your shoulders, and keep your knees under your hips. You may place padding under your knees for comfort. 2. Let your head hang down, and point your tailbone toward the floor  so your lower back becomes rounded like the back of a cat. 3. Hold this position for 5 seconds. 4. Slowly lift your head and point your tailbone up toward the ceiling so your back forms a sagging arch like the back of a cow. 5. Hold this position for 5 seconds.  Press-Ups  Repeat these steps 5-10 times: 1. Lie on your abdomen (face-down) on the floor. 2. Place your palms near your head, about shoulder-width apart. 3. While you keep your back as relaxed as possible and keep your hips on the floor, slowly straighten your arms to raise the top half of your body and lift your shoulders. Do not use your back muscles to raise your upper torso. You may adjust the placement of your hands to make yourself more comfortable. 4. Hold this position for 5 seconds while you keep  your back relaxed. 5. Slowly return to lying flat on the floor.  Bridges  Repeat these steps 10 times: 1. Lie on your back on a firm surface. 2. Bend your knees so they are pointing toward the ceiling and your feet are flat on the floor. 3. Tighten your buttocks muscles and lift your buttocks off of the floor until your waist is at almost the same height as your knees. You should feel the muscles working in your buttocks and the back of your thighs. If you do not feel these muscles, slide your feet 1-2 inches farther away from your buttocks. 4. Hold this position for 3-5 seconds. 5. Slowly lower your hips to the starting position, and allow your buttocks muscles to relax completely.  If this exercise is too easy, try doing it with your arms crossed over your chest. Abdominal Crunches  Repeat these steps 5-10 times: 1. Lie on your back on a firm bed or the floor with your legs extended. 2. Bend your knees so they are pointing toward the ceiling and your feet are flat on the floor. 3. Cross your arms over your chest. 4. Tip your chin slightly toward your chest without bending your neck. 5. Tighten your abdominal muscles and  slowly raise your trunk (torso) high enough to lift your shoulder blades a tiny bit off of the floor. Avoid raising your torso higher than that, because it can put too much stress on your low back and it does not help to strengthen your abdominal muscles. 6. Slowly return to your starting position.  Back Lifts Repeat these steps 5-10 times: 1. Lie on your abdomen (face-down) with your arms at your sides, and rest your forehead on the floor. 2. Tighten the muscles in your legs and your buttocks. 3. Slowly lift your chest off of the floor while you keep your hips pressed to the floor. Keep the back of your head in line with the curve in your back. Your eyes should be looking at the floor. 4. Hold this position for 3-5 seconds. 5. Slowly return to your starting position.  Contact a health care provider if:  Your back pain or discomfort gets much worse when you do an exercise.  Your back pain or discomfort does not lessen within 2 hours after you exercise. If you have any of these problems, stop doing these exercises right away. Do not do them again unless your health care provider says that you can. Get help right away if:  You develop sudden, severe back pain. If this happens, stop doing the exercises right away. Do not do them again unless your health care provider says that you can. This information is not intended to replace advice given to you by your health care provider. Make sure you discuss any questions you have with your health care provider. Document Released: 05/13/2004 Document Revised: 08/13/2015 Document Reviewed: 05/30/2014 Elsevier Interactive Patient Education  2017 [employer].  IF you received an x-ray today, you will receive an invoice from [facility]. Please contact [facility] at [phone] with questions or concerns regarding your invoice.   IF you received labwork today, you will receive an invoice from [facility]. Please contact [facility] at  [phone] with questions or concerns regarding your invoice.   Our billing staff will not be able to assist you with questions regarding bills from these companies.  You will be contacted with the lab results as soon as they are available. The fastest way to get your results  is to activate your My Chart account. Instructions are located on the last page of this paperwork. If you have not heard from us regarding the results in 2 weeks, please contact this office.     
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
## 2016-12-06 ENCOUNTER — Ambulatory Visit (HOSPITAL_COMMUNITY)
Admission: RE | Admit: 2016-12-06 | Discharge: 2016-12-06 | Disposition: A | Discharge status: Home / Self Care | Payer: Self-pay | Source: Ambulatory Visit | Attending: Physician Assistant | Admitting: Physician Assistant

## 2016-12-06 ENCOUNTER — Ambulatory Visit: Payer: Self-pay | Admitting: Urgent Care

## 2016-12-06 ENCOUNTER — Encounter: Payer: Self-pay | Admitting: Physician Assistant

## 2016-12-06 ENCOUNTER — Ambulatory Visit (INDEPENDENT_AMBULATORY_CARE_PROVIDER_SITE_OTHER): Payer: Self-pay | Admitting: Physician Assistant

## 2016-12-06 ENCOUNTER — Emergency Department (HOSPITAL_COMMUNITY)
Admission: EM | Admit: 2016-12-06 | Discharge: 2016-12-06 | Disposition: A | Discharge status: Home / Self Care | Payer: Self-pay | Attending: Emergency Medicine | Admitting: Emergency Medicine

## 2016-12-06 VITALS — BP 125/85 | HR 69 | Temp 97.9°F | Resp 16 | Ht 71.75 in | Wt 234.8 lb

## 2016-12-06 DIAGNOSIS — L299 Pruritus, unspecified: Secondary | ICD-10-CM

## 2016-12-06 DIAGNOSIS — Z5321 Procedure and treatment not carried out due to patient leaving prior to being seen by health care provider: Secondary | ICD-10-CM | POA: Insufficient documentation

## 2016-12-06 DIAGNOSIS — N5089 Other specified disorders of the male genital organs: Secondary | ICD-10-CM

## 2016-12-06 DIAGNOSIS — N4889 Other specified disorders of penis: Secondary | ICD-10-CM

## 2016-12-06 LAB — POCT URINALYSIS DIP (MANUAL ENTRY)
Bilirubin, UA: NEGATIVE
Blood, UA: NEGATIVE
Glucose, UA: NEGATIVE mg/dL
Ketones, POC UA: NEGATIVE mg/dL
Leukocytes, UA: NEGATIVE
Nitrite, UA: NEGATIVE
Protein Ur, POC: NEGATIVE mg/dL
Spec Grav, UA: 1.02 (ref 1.010–1.025)
Urobilinogen, UA: 0.2 E.U./dL
pH, UA: 8.5 — AB (ref 5.0–8.0)

## 2016-12-06 LAB — POCT SKIN KOH: Skin KOH, POC: NEGATIVE

## 2016-12-06 MED ORDER — RANITIDINE HCL 150 MG PO TABS: 150.0000 mg | ORAL_TABLET | Freq: Two times a day (BID) | ORAL | 0 refills | Status: AC

## 2016-12-06 MED ORDER — METHYLPREDNISOLONE ACETATE 80 MG/ML IJ SUSP
80.0000 mg | Freq: Once | INTRAMUSCULAR | Status: AC
  Administered 2016-12-06: 80 mg via INTRAMUSCULAR

## 2016-12-06 MED ORDER — PREDNISONE 20 MG PO TABS: 40.0000 mg | ORAL_TABLET | Freq: Every day | ORAL | 0 refills | Status: AC

## 2016-12-06 NOTE — Patient Instructions (Addendum)
benadryl 40m every 4-6 hrs x 2 days. Then take Zantac AND zyrtec as prescribed for 5 days. Prednisone: Take 443mdaily (with food) daily for 1 week. Take this after meals or with food or milk to decrease GI upset  Thank you for coming in today. I hope you feel we met your needs.  Feel free to call PCP if you have any questions or further requests.  Please consider signing up for MyChart if you do not already have it, as this is a great way to communicate with me.  Best,  Whitney McVey, PA-C   Angioedema Angioedema is the sudden swelling of tissue in the body. Angioedema can affect any part of the body, but it most often affects the deeper parts of the skin, causing red, itchy patches (hives) to appear over the affected area. It often begins during the night and is found in the morning. Depending on the cause, angioedema may happen:  Only once.  Several times. It may come back in unpredictable patterns.  Repeatedly for several years. Over time, it may gradually stop coming back.  Angioedema can be life-threatening if it affects the air passages that you breathe through. What are the causes? This condition may be caused by:  Foods, such as milk, eggs, shellfish, wheat, or nuts.  Certain medicines, such as ACE inhibitors, antibiotics, nonsteroidal anti-inflammatory drugs, birth control pills, or dyes used in X-rays.  Insect stings.  Infections.  Angioedema can be inherited, and episodes can be triggered by:  Mild injury.  Dental work.  Surgery.  Stress.  Sudden changes in temperature.  Exercise.  In some cases, the cause of this condition is not known. What are the signs or symptoms? Symptoms of this condition depend on where the swelling happens. Symptoms may include:  Swollen skin.  Red, itchy patches of skin (hives).  Redness in the affected area.  Pain in the affected area.  Swollen lips or tongue.  Wheezing.  Breathing problems.  If your internal  organs are involved, symptoms may also include:  Nausea.  Abdominal pain.  Vomiting.  Difficulty swallowing.  Difficulty passing urine.  How is this diagnosed? This condition may be diagnosed based on:  An exam of the affected area.  Your medical history.  Whether anyone in your family has had this condition before.  A review of any medicines you have been taking.  Tests, including: ? Allergy skin tests to see if the condition was caused by an allergic reaction. ? Blood tests to see if the condition was caused by a gene. ? Tests to check for underlying diseases that could cause the condition.  How is this treated? Treatment for this condition depends on the cause. It may involve any of the following:  If something triggered the condition, making changes to keep it from triggering the condition again.  If the condition affects your breathing, having tubes placed in your airway to keep it open.  Taking medicines to treat symptoms or prevent future episodes. These may include: ? Antihistamines. ? Epinephrine injections. ? Steroids.  If your condition is severe, you may need to be treated at the hospital. Angioedema usually gets better in 24-48 hours. Follow these instructions at home:  Take over-the-counter and prescription medicines only as told by your health care provider.  If you were given medicines for emergency allergy treatment, always carry them with you.  Wear a medical bracelet as told by your health care provider.  If something triggers your condition, avoid the  trigger, if possible.  If your condition is inherited and you are thinking about having children, talk to your health care provider. It is important to discuss the risks of passing on the condition to your children. Contact a health care provider if:  You have repeated episodes of angioedema.  Episodes of angioedema start to happen more often than they used to, even after you take steps to  prevent them.  You have episodes of angioedema that are more severe than they have been before, even after you take steps to prevent them.  You are thinking about having children. Get help right away if:  You have severe swelling of your mouth, tongue, or lips.  You have trouble breathing.  You have trouble swallowing.  You faint. This information is not intended to replace advice given to you by your health care provider. Make sure you discuss any questions you have with your health care provider. Document Released: 06/14/2001 Document Revised: 11/01/2015 Document Reviewed: 10/14/2015 Elsevier Interactive Patient Education  2018 [employer].  IF you received an x-ray today, you will receive an invoice from [facility]. Please contact [facility] at [phone] with questions or concerns regarding your invoice.   IF you received labwork today, you will receive an invoice from [facility]. Please contact [facility] at [phone] with questions or concerns regarding your invoice.   Our billing staff will not be able to assist you with questions regarding bills from these companies.  You will be contacted with the lab results as soon as they are available. The fastest way to get your results is to activate your My Chart account. Instructions are located on the last page of this paperwork. If you have not heard from [place] regarding the results in 2 weeks, please contact this office.

## 2016-12-06 NOTE — Progress Notes (Addendum)
Richard Atkinson  MRN: [id number] DOB: 1975/09/14  PCP: Patient, No Pcp Per  Subjective:  Pt is a 41 year old male who presents to clinic for groin swelling and itching x 5 days. C/o swelling and itching of scrotum and penis.  He ate strawberries 3 days ago. Admits to masturbation without lubrication a few days ago. No new medications. Denies new cream, lotion, soap, clothes, underwear, detergent.  He has tried a hydrocortisone OTC cream - not helping.   He has sexual intercourse with his wife only. Declines STD testing today.  Denies pain, discharge from penis, urinary symptoms, blood in urine or semen, swelling of legs or face, redness, fever, chills.   Review of Systems  Constitutional: Negative for activity change, appetite change and fatigue.  HENT: Negative for congestion, rhinorrhea and sneezing.   Respiratory: Negative for cough, chest tightness, shortness of breath and wheezing.   Cardiovascular: Negative for chest pain, palpitations and leg swelling.  Gastrointestinal: Negative for abdominal pain, constipation, diarrhea, nausea and vomiting.  Genitourinary: Positive for penile swelling and scrotal swelling. Negative for decreased urine volume, difficulty urinating, discharge, dysuria, enuresis, genital sores, hematuria, penile pain, testicular pain and urgency.  Musculoskeletal: Negative for back pain and neck stiffness.  Skin: Negative for color change and wound.  Neurological: Negative for dizziness, syncope, weakness, light-headedness and headaches.    Patient Active Problem List   Diagnosis Date Noted  . 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 HYDROcodone-acetaminophen (NORCO/VICODIN) 5-325 MG tablet Take 1 tablet by mouth every 6 (six) hours as needed. (Patient not taking: Reported on 12/06/2016) 12 tablet 0  . ibuprofen (ADVIL,MOTRIN) 600 MG tablet Take 1 tablet (600 mg  total) by mouth every 6 (six) hours as needed. (Patient not taking: Reported on 12/06/2016) 60 tablet 4  . meloxicam (MOBIC) 15 MG tablet Take 1 tablet (15 mg total) by mouth daily. (Patient not taking: Reported on 12/06/2016) 60 tablet 1  . methocarbamol (ROBAXIN) 500 MG tablet Take 1 tablet (500 mg total) by mouth 2 (two) times daily. (Patient not taking: Reported on 12/06/2016) 60 tablet 1   No current facility-administered medications on file prior to visit.     No Known Allergies   Objective:  BP 125/85   Pulse 69   Temp 97.9 F (36.6 C) (Oral)   Resp 16   Ht 5' 11.75" (1.822 m)   Wt 234 lb 12.8 oz (106.5 kg)   SpO2 98%   BMI 32.07 kg/m   Physical Exam  Constitutional: He is oriented to person, place, and time and well-developed, well-nourished, and in no distress. No distress.  Genitourinary: He exhibits no abnormal testicular mass, no testicular tenderness, no abnormal scrotal mass, no scrotal tenderness and no epididymal tenderness. Penis exhibits edema. Penis exhibits no lesions. No discharge found.  Genitourinary Comments: B/l scrotum is diffusely edematous. Mild induration proximal scrotum. No erythema or increased warmth. No TTP. Corona of penis is also edematous.  No drainage from penis. No lesions or excoriations. There is no priapism.    Neurological: He is alert and oriented to person, place, and time. GCS score is 15.  Skin: Skin is warm and dry.  Psychiatric: Mood, memory, affect and judgment normal.   Results for orders placed or performed in visit on 12/06/16  POCT urinalysis dipstick  Result Value Ref Range   Color, UA yellow yellow   Clarity, UA clear  clear   Glucose, UA negative negative mg/dL   Bilirubin, UA negative negative   Ketones, POC UA negative negative mg/dL   Spec Grav, UA 8.381 8.403 - 1.025   Blood, UA negative negative   pH, UA 8.5 (A) 5.0 - 8.0   Protein Ur, POC negative negative mg/dL   Urobilinogen, UA 0.2 0.2 or 1.0 E.U./dL   Nitrite, UA  Negative Negative   Leukocytes, UA Negative Negative  POCT Skin KOH  Result Value Ref Range   Skin KOH, POC Negative Negative    Assessment and Plan :  1. Scrotal edema 2. Swelling, penis 3. Itching - US Scrotum; Future - POCT urinalysis dipstick - POCT Skin KOH - [place] Art/Ven Flow Abd Pelv Doppler Limited; Future - Penile and scrotal edema x 5 days of unknown eitiology. POCT labs are negative. Low likelihood of torsion, orchitis or Fournier's gangrene, however cannot rule this out - Will get STAT imaging. Suspect likely allergic reaction. He declines testing for GC/Chlamydia today. Will contact pt with results and plan.   Addendum: 1. Scrotal edema 2. Swelling, penis 3. Itching - Pt returned to clinic after his ultrasound, which was negative. Suspect allergic reaction and will treat as such. RTC in 5-7 days if no improvement.  - methylPREDNISolone acetate (DEPO-MEDROL) injection 80 mg; Inject 1 mL (80 mg total) into the muscle once. - ranitidine (ZANTAC) 150 MG tablet; Take 1 tablet (150 mg total) by mouth 2 (two) times daily.  Dispense: 60 tablet; Refill: 0 - predniSONE (DELTASONE) 20 MG tablet; Take 2 tablets (40 mg total) by mouth daily with breakfast.  Dispense: 14 tablet; Refill: 0   Marco Collie, PA-C  Primary Care at [facility] 12/06/2016 2:10 PM

## 2016-12-07 NOTE — Progress Notes (Signed)
Discussed results with pt.

## 2017-10-10 ENCOUNTER — Ambulatory Visit (INDEPENDENT_AMBULATORY_CARE_PROVIDER_SITE_OTHER): Payer: Self-pay | Admitting: Orthopaedic Surgery

## 2019-05-31 ENCOUNTER — Ambulatory Visit: Payer: Self-pay | Attending: Internal Medicine

## 2019-05-31 DIAGNOSIS — Z20822 Contact with and (suspected) exposure to covid-19: Secondary | ICD-10-CM | POA: Insufficient documentation

## 2019-06-01 LAB — NOVEL CORONAVIRUS, NAA: SARS-CoV-2, NAA: NOT DETECTED

## 2019-06-13 ENCOUNTER — Telehealth (INDEPENDENT_AMBULATORY_CARE_PROVIDER_SITE_OTHER): Payer: Self-pay | Admitting: Registered Nurse

## 2019-06-13 ENCOUNTER — Encounter: Payer: Self-pay | Admitting: Registered Nurse

## 2019-06-13 ENCOUNTER — Other Ambulatory Visit: Payer: Self-pay

## 2019-06-13 DIAGNOSIS — G43909 Migraine, unspecified, not intractable, without status migrainosus: Secondary | ICD-10-CM

## 2019-06-13 MED ORDER — SUMATRIPTAN SUCCINATE 50 MG PO TABS: 50.0000 mg | ORAL_TABLET | Freq: Once | ORAL | 0 refills | Status: AC

## 2019-06-13 NOTE — Patient Instructions (Signed)
° ° ° °  If you have lab work done today you will be contacted with your lab results within the next 2 weeks.  If you have not heard from us then please contact us. The fastest way to get your results is to register for My Chart. ° ° °IF you received an x-ray today, you will receive an invoice from [facility]. Please contact [facility] at [phone] with questions or concerns regarding your invoice.  ° °IF you received labwork today, you will receive an invoice from [facility]. Please contact [facility] at [phone] with questions or concerns regarding your invoice.  ° °Our billing staff will not be able to assist you with questions regarding bills from these companies. ° °You will be contacted with the lab results as soon as they are available. The fastest way to get your results is to activate your My Chart account. Instructions are located on the last page of this paperwork. If you have not heard from us regarding the results in 2 weeks, please contact this office. °  ° ° ° °

## 2019-06-13 NOTE — Progress Notes (Signed)
Telemedicine Encounter- SOAP NOTE Established Patient  This telephone encounter was conducted with the patient's (or proxy's) verbal consent via audio telecommunications: yes  Patient was instructed to have this encounter in a suitably private space; and to only have persons present to whom they give permission to participate. In addition, patient identity was confirmed by use of name plus two identifiers (DOB and address).  I discussed the limitations, risks, security and privacy concerns of performing an evaluation and management service by telephone and the availability of in person appointments. I also discussed with the patient that there may be a patient responsible charge related to this service. The patient expressed understanding and agreed to proceed.  I spent a total of 13 minutes talking with the patient or their proxy.  No chief complaint on file.   Subjective   Richard Atkinson is a 44 y.o. established patient. Telephone visit today for headache  HPI Slow onset 3-4 days ago. Waxing and waning. Unilateral to right side. Feels sharp pressure. Worse with excessive movement, ie when dropping head in prayer. Ibuprofen for relief, somewhat effective, but headache has been persistent. Denies visual changes, sensitivity to light and sound, neck or back pain, peripheral neuro symptoms, chest pain, shob, doe, cough, URI symptoms, sick contacts, fevers.   Patient Active Problem List   Diagnosis Date Noted  . 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 No pertinent past medical history.  Current Outpatient Medications  Medication Sig Dispense Refill  . clotrimazole (JOCK ITCH) 1 % cream Apply 1 application topically 2 (two) times daily.    . ranitidine (ZANTAC) 150 MG tablet Take 1 tablet (150 mg total) by mouth 2 (two) times daily. (Patient not taking: Reported on 06/13/2019) 60 tablet 0  . SUMAtriptan (IMITREX) 50 MG tablet Take 1 tablet (50 mg total) by  mouth once for 1 dose. May repeat in 2 hours if headache persists or recurs. Then take once daily until headache resolves. 10 tablet 0   No current facility-administered medications for this visit.    No Known Allergies  Social History   Socioeconomic History  . Marital status: Single    Spouse name: Not on file  . Number of children: Not on file  . Years of education: Not on file  . Highest education level: Not on file  Occupational History  . Not on file  Tobacco Use  . Smoking status: Never Smoker  . Smokeless tobacco: Never Used  Substance and Sexual Activity  . Alcohol use: No  . Drug use: No  . Sexual activity: Yes  Other Topics Concern  . Not on file  Social History Narrative  . Not on file   Social Determinants of Health   Financial Resource Strain:   . Difficulty of Paying Living Expenses: Not on file  Food Insecurity:   . Worried About Programme researcher, broadcasting/film/video in the Last Year: Not on file  . Ran Out of Food in the Last Year: Not on file  Transportation Needs:   . Lack of Transportation (Medical): Not on file  . Lack of Transportation (Non-Medical): Not on file  Physical Activity:   . Days of Exercise per Week: Not on file  . Minutes of Exercise per Session: Not on file  Stress:   . Feeling of Stress : Not on file  Social Connections:   . Frequency of Communication with Friends and Family: Not on file  . Frequency of Social Gatherings with Friends  and Family: Not on file  . Attends Religious Services: Not on file  . Active Member of Clubs or Organizations: Not on file  . Attends Archivist Meetings: Not on file  . Marital Status: Not on file  Intimate Partner Violence:   . Fear of Current or Ex-Partner: Not on file  . Emotionally Abused: Not on file  . Physically Abused: Not on file  . Sexually Abused: Not on file    ROS Per hpi  Objective   Vitals as reported by the patient: There were no vitals filed for this visit.  Diagnoses and all  orders for this visit:  Migraine without status migrainosus, not intractable, unspecified migraine type -     SUMAtriptan (IMITREX) 50 MG tablet; Take 1 tablet (50 mg total) by mouth once for 1 dose. May repeat in 2 hours if headache persists or recurs. Then take once daily until headache resolves.   PLAN  Try sumatriptan for what seems to be migraine headache - discussed that he may take 1, then 1 more tab 2 hours later if headache persists, then take one daily until headache resolves.  If headaches persist or red flag symptoms arise, he will return to clinic  Patient encouraged to call clinic with any questions, comments, or concerns.   I discussed the assessment and treatment plan with the patient. The patient was provided an opportunity to ask questions and all were answered. The patient agreed with the plan and demonstrated an understanding of the instructions.   The patient was advised to call back or seek an in-person evaluation if the symptoms worsen or if the condition fails to improve as anticipated.  I provided 13 minutes of non-face-to-face time during this encounter.  Maximiano Coss, NP  Primary Care at [facility]

## 2019-06-13 NOTE — Progress Notes (Signed)
Patient states he has been having severe headaches for about 4 days now. Per patient  When he pray the headache get worse. He has been taking some ibuprofen and had some relief but it continue to come back.

## 2019-08-16 IMAGING — US US SCROTUM
1 series · 14 of 25 positions shown · non-contrast
Comparison: None.

:
CLINICAL DATA: Five days of testicular swelling and pruritis

EXAM:
SCROTAL ULTRASOUND
DOPPLER ULTRASOUND OF THE TESTICLES
TECHNIQUE: Complete ultrasound examination of the testicles, epididymis, and
other scrotal structures was performed. Color and spectral Doppler
ultrasound were also utilized to evaluate blood flow to the
testicles.

[Series 1: us scrotum · 0.09mm/px · 14 of 62 slices shown]
[im 1/62]
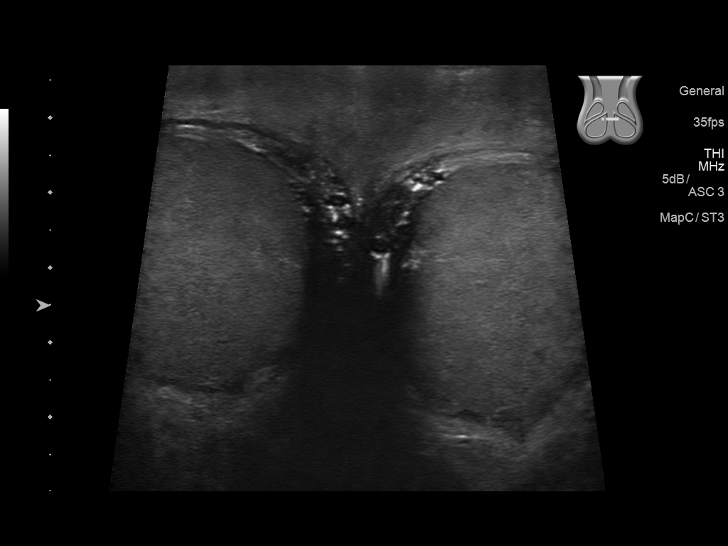
[im 6/62]
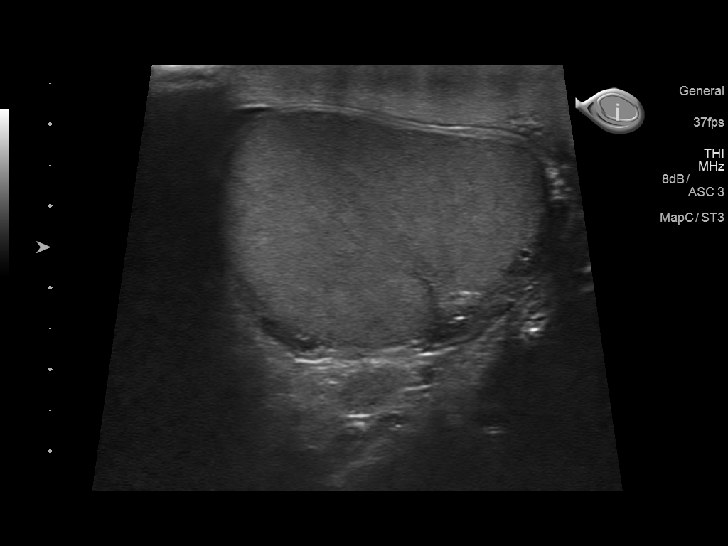
[im 11/62]
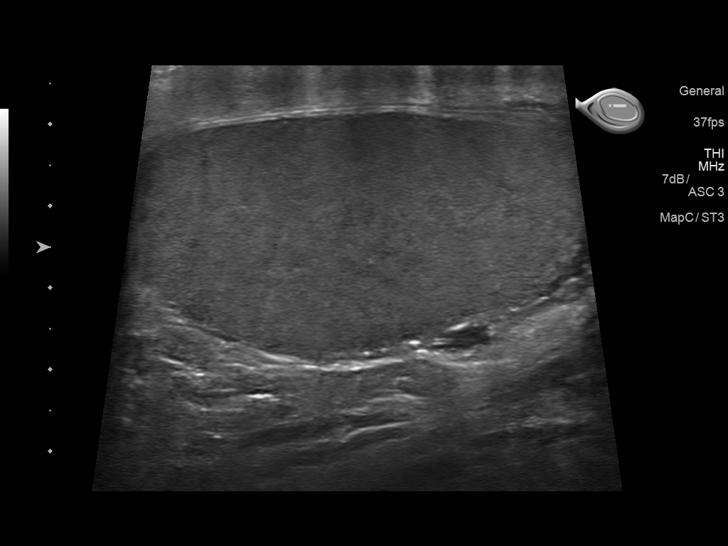
[im 16/62]
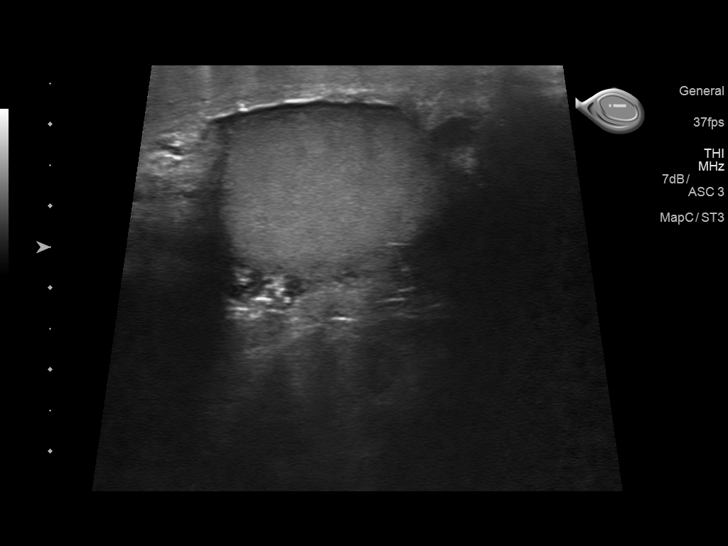
[im 21/62]
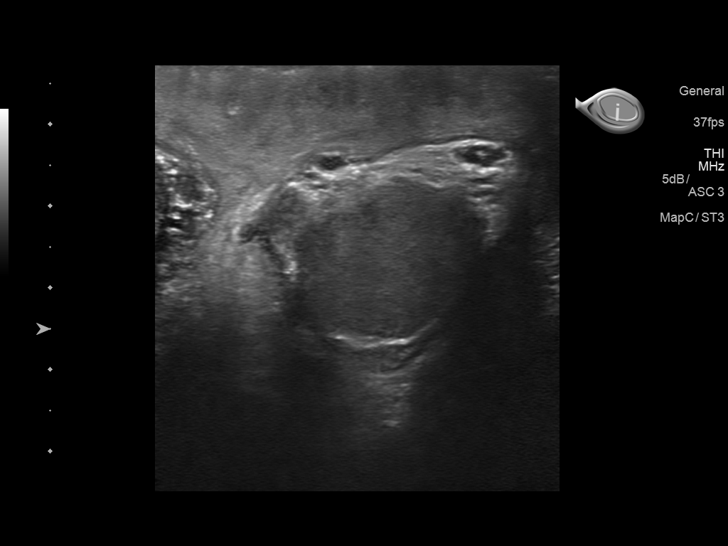
[im 23/62]
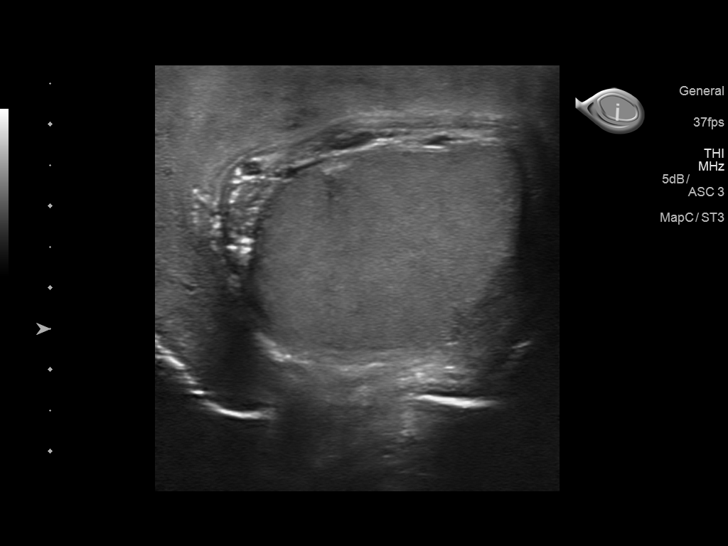
[im 28/62]
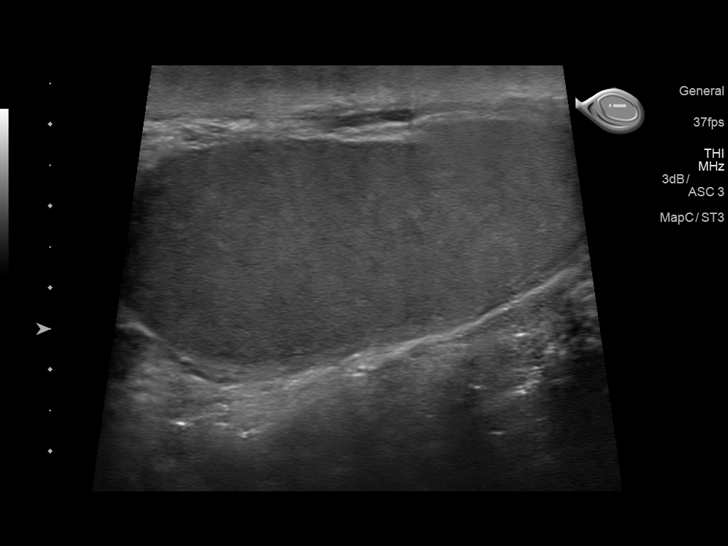
[im 34/62]
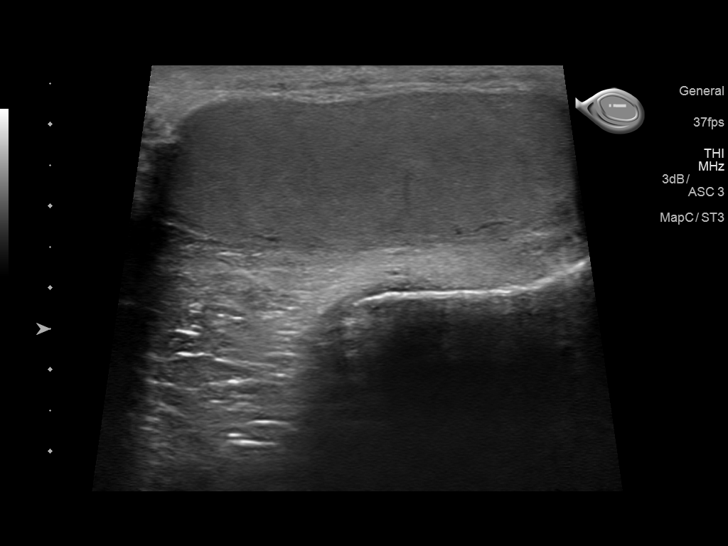
[im 39/62]
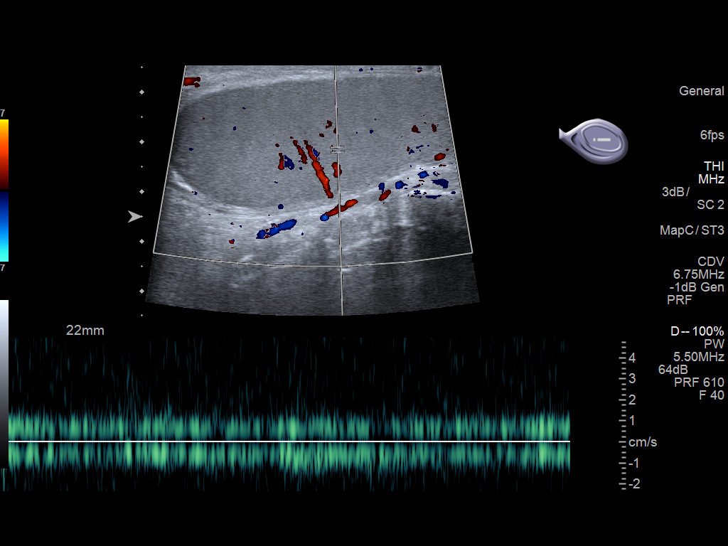
[im 41/62]
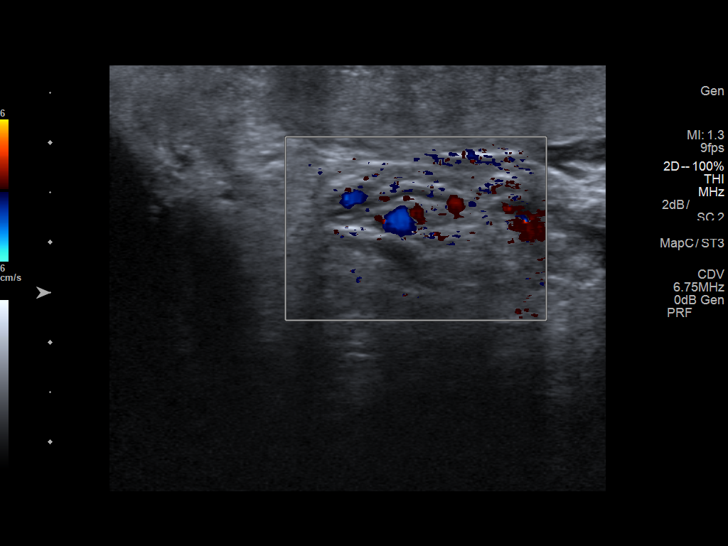
[im 46/62]
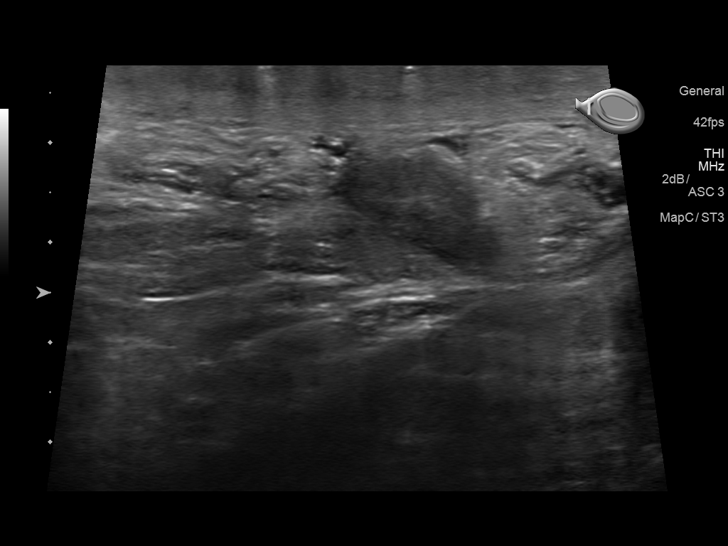
[im 51/62]
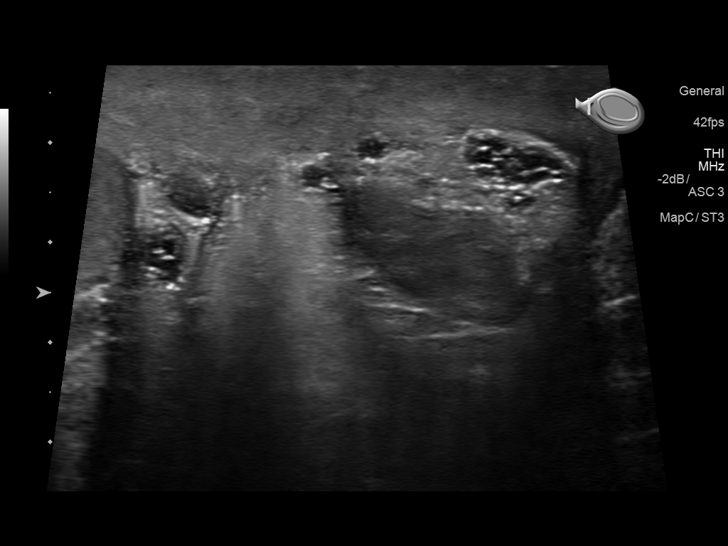
[im 56/62]
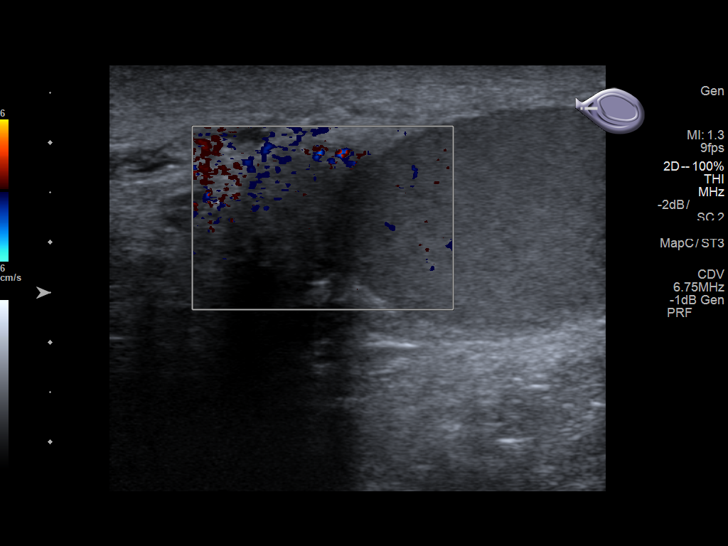
[im 62/62]
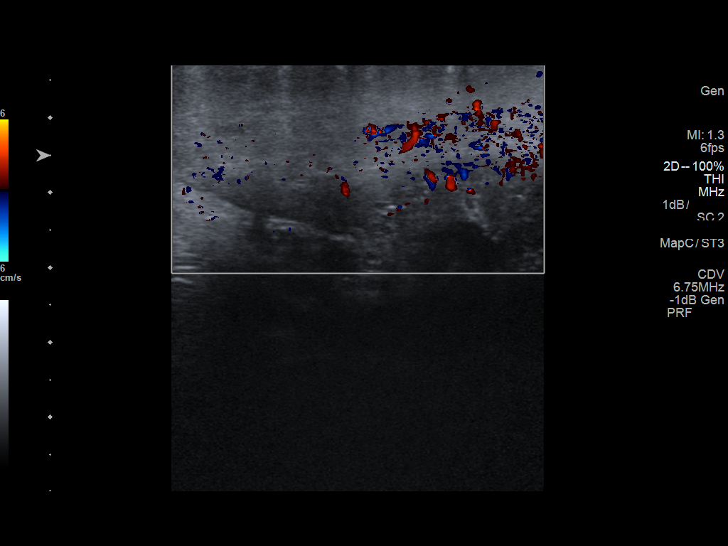

[14 of 25 positions shown; findings below may reference images not displayed]

FINDINGS: Right testicle

Measurements: Normal in size and homogeneous in echotexture
measuring 5.6 x 2.9 x 4.2 cm.

Left testicle

Measurements: Normal size and homogeneous echotexture measuring
x 2.7 x 3.2 cm.

Right epididymis: Normal in size and appearance.

Left epididymis: Normal in size and appearance.

Hydrocele: None visualized.

Varicocele: None visualized.

Pulsed Doppler interrogation of both testes demonstrates normal low
resistance arterial and venous waveforms bilaterally.

Other: Scrotal wall thickening diffusely
IMPRESSION: 1. Diffuse scrotal wall thickening.
2. Normal testicles with normal blood flow
3. No hydrocele.

## 2020-08-12 ENCOUNTER — Encounter (HOSPITAL_COMMUNITY): Payer: Self-pay

## 2020-08-12 ENCOUNTER — Emergency Department (HOSPITAL_COMMUNITY)
Admission: EM | Admit: 2020-08-12 | Discharge: 2020-08-12 | Disposition: A | Discharge status: Home / Self Care | Payer: 59 | Attending: Emergency Medicine | Admitting: Emergency Medicine

## 2020-08-12 ENCOUNTER — Emergency Department (HOSPITAL_COMMUNITY): Payer: 59

## 2020-08-12 ENCOUNTER — Other Ambulatory Visit: Payer: Self-pay

## 2020-08-12 DIAGNOSIS — Z7982 Long term (current) use of aspirin: Secondary | ICD-10-CM | POA: Insufficient documentation

## 2020-08-12 DIAGNOSIS — Z20822 Contact with and (suspected) exposure to covid-19: Secondary | ICD-10-CM | POA: Diagnosis not present

## 2020-08-12 DIAGNOSIS — I4891 Unspecified atrial fibrillation: Secondary | ICD-10-CM | POA: Insufficient documentation

## 2020-08-12 DIAGNOSIS — M791 Myalgia, unspecified site: Secondary | ICD-10-CM | POA: Diagnosis not present

## 2020-08-12 DIAGNOSIS — R6883 Chills (without fever): Secondary | ICD-10-CM | POA: Insufficient documentation

## 2020-08-12 DIAGNOSIS — R0989 Other specified symptoms and signs involving the circulatory and respiratory systems: Secondary | ICD-10-CM | POA: Diagnosis not present

## 2020-08-12 DIAGNOSIS — R42 Dizziness and giddiness: Secondary | ICD-10-CM | POA: Diagnosis present

## 2020-08-12 LAB — CBC WITH DIFFERENTIAL/PLATELET
Abs Immature Granulocytes: 0.02 10*3/uL (ref 0.00–0.07)
Basophils Absolute: 0 10*3/uL (ref 0.0–0.1)
Basophils Relative: 0 %
Eosinophils Absolute: 0.1 10*3/uL (ref 0.0–0.5)
Eosinophils Relative: 1 %
HCT: 53.5 % — ABNORMAL HIGH (ref 39.0–52.0)
Hemoglobin: 18.6 g/dL — ABNORMAL HIGH (ref 13.0–17.0)
Immature Granulocytes: 0 %
Lymphocytes Relative: 16 %
Lymphs Abs: 1.2 10*3/uL (ref 0.7–4.0)
MCH: 31.5 pg (ref 26.0–34.0)
MCHC: 34.8 g/dL (ref 30.0–36.0)
MCV: 90.5 fL (ref 80.0–100.0)
Monocytes Absolute: 0.7 10*3/uL (ref 0.1–1.0)
Monocytes Relative: 9 %
Neutro Abs: 5.2 10*3/uL (ref 1.7–7.7)
Neutrophils Relative %: 74 %
Platelets: 149 10*3/uL — ABNORMAL LOW (ref 150–400)
RBC: 5.91 MIL/uL — ABNORMAL HIGH (ref 4.22–5.81)
RDW: 11.9 % (ref 11.5–15.5)
WBC: 7.1 10*3/uL (ref 4.0–10.5)
nRBC: 0 % (ref 0.0–0.2)

## 2020-08-12 LAB — COMPREHENSIVE METABOLIC PANEL
ALT: 11 U/L (ref 0–44)
AST: 28 U/L (ref 15–41)
Albumin: 3.6 g/dL (ref 3.5–5.0)
Alkaline Phosphatase: 49 U/L (ref 38–126)
Anion gap: 7 (ref 5–15)
BUN: 8 mg/dL (ref 6–20)
CO2: 27 mmol/L (ref 22–32)
Calcium: 9.1 mg/dL (ref 8.9–10.3)
Chloride: 101 mmol/L (ref 98–111)
Creatinine, Ser: 0.84 mg/dL (ref 0.61–1.24)
GFR, Estimated: >60 mL/min (ref >60)
Glucose, Bld: 111 mg/dL — ABNORMAL HIGH (ref 70–99)
Potassium: 3.9 mmol/L (ref 3.5–5.1)
Sodium: 135 mmol/L (ref 135–145)
Total Bilirubin: 1.4 mg/dL — ABNORMAL HIGH (ref 0.3–1.2)
Total Protein: 7.2 g/dL (ref 6.5–8.1)

## 2020-08-12 LAB — RESP PANEL BY RT-PCR (FLU A&B, COVID) ARPGX2
Influenza A by PCR: NEGATIVE
Influenza B by PCR: NEGATIVE
SARS Coronavirus 2 by RT PCR: NEGATIVE

## 2020-08-12 LAB — MAGNESIUM: Magnesium: 1.8 mg/dL (ref 1.7–2.4)

## 2020-08-12 LAB — TSH: TSH: 0.525 u[IU]/mL (ref 0.350–4.500)

## 2020-08-12 MED ORDER — MELOXICAM 15 MG PO TABS: 15.0000 mg | ORAL_TABLET | Freq: Every day | ORAL | 0 refills | Status: AC

## 2020-08-12 MED ORDER — ASPIRIN 81 MG PO CHEW: 81.0000 mg | CHEWABLE_TABLET | Freq: Every day | ORAL | 0 refills | Status: AC

## 2020-08-12 MED ORDER — KETOROLAC TROMETHAMINE 15 MG/ML IJ SOLN
15.0000 mg | Freq: Once | INTRAMUSCULAR | Status: AC
  Administered 2020-08-12: 15 mg via INTRAVENOUS
  Filled 2020-08-12: qty 1

## 2020-08-12 MED ORDER — SODIUM CHLORIDE 0.9 % IV BOLUS
1000.0000 mL | Freq: Once | INTRAVENOUS | Status: AC
  Administered 2020-08-12: 1000 mL via INTRAVENOUS

## 2020-08-12 MED ORDER — METOPROLOL SUCCINATE ER 25 MG PO TB24: 25.0000 mg | ORAL_TABLET | Freq: Every day | ORAL | 0 refills | Status: AC

## 2020-08-12 MED ORDER — OMEPRAZOLE 20 MG PO CPDR: 20.0000 mg | DELAYED_RELEASE_CAPSULE | Freq: Every day | ORAL | 0 refills | Status: AC

## 2020-08-12 NOTE — ED Provider Notes (Signed)
[facility] EMERGENCY DEPARTMENT Provider Note   CSN: [id number] Arrival date & time: 08/12/20  1324     History Chief Complaint  Patient presents with  . Chills    Richard Atkinson is a 45 y.o. male presenting for evaluation of chills, body ache, headache, dizziness.  Patient states he has been sick since Thursday, for the past 5 days.  He has been treating his symptoms with Tylenol, started NyQuil and DayQuil yesterday.  He reports persistent symptoms and today developed dizziness, which prompted his ER visit.  He states dizziness felt like the room was spinning.  It lasted for about 20 seconds before resolving without intervention.  He has not had any dizziness since.  He denies any chest pain at the time.  He took a at-home COVID test yesterday, which was negative.  He denies fever, sore throat, nasal congestion, chest pain, shortness of breath, nausea, vomit, abdominal pain, urinary symptoms.  He does report diarrhea over the past 2 days.  He is vaccinated for COVID but not with a booster.  He is not vaccinated for flu.  He denies sick contacts.  He has no medical problems, takes medications daily.  He has been fasting for Ramadan, although he did break fast yesterday and today due to illness.  HPI     History reviewed. No pertinent past medical history.  Patient Active Problem List   Diagnosis Date Noted  . 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 No pertinent surgical history.     History reviewed. No pertinent family history.  Social History   Tobacco Use  . Smoking status: Never Smoker  . Smokeless tobacco: Never Used  Substance Use Topics  . Alcohol use: No  . Drug use: No    Home Medications Prior to Admission medications   Medication Sig Start Date End Date Taking? Authorizing Provider  aspirin 81 MG chewable tablet Chew 1 tablet (81 mg total) by mouth daily. 08/12/20  Yes Daxx Tiggs, PA-C  meloxicam (MOBIC)  15 MG tablet Take 1 tablet (15 mg total) by mouth daily. 08/12/20  Yes Derwood Kaplan, MD  metoprolol succinate (TOPROL-XL) 25 MG 24 hr tablet Take 1 tablet (25 mg total) by mouth daily. 08/12/20  Yes Jocie Meroney, PA-C  omeprazole (PRILOSEC) 20 MG capsule Take 1 capsule (20 mg total) by mouth daily. 08/12/20  Yes Derwood Kaplan, MD  clotrimazole (JOCK ITCH) 1 % cream Apply 1 application topically 2 (two) times daily.    [provider]  ranitidine (ZANTAC) 150 MG tablet Take 1 tablet (150 mg total) by mouth 2 (two) times daily. Patient not taking: Reported on 06/13/2019 12/06/16   McVey, Madelaine Bhat, PA-C  SUMAtriptan (IMITREX) 50 MG tablet Take 1 tablet (50 mg total) by mouth once for 1 dose. May repeat in 2 hours if headache persists or recurs. Then take once daily until headache resolves. 06/13/19 06/13/19  Janeece Agee, NP    Allergies    Patient has no known allergies.  Review of Systems   Review of Systems  Constitutional: Positive for chills.  Respiratory: Positive for cough.   Gastrointestinal: Positive for diarrhea.  Musculoskeletal: Positive for myalgias.  Neurological: Positive for dizziness.  All other systems reviewed and are negative.   Physical Exam Updated Vital Signs BP 97/75   Pulse 73   Temp 98.1 F (36.7 C) (Oral)   Resp (!) 24   SpO2 96%   Physical Exam Vitals and nursing note reviewed.  Constitutional:      General: He is not in acute distress.    Appearance: He is well-developed.     Comments: Resting in the bed in NAD  HENT:     Head: Normocephalic and atraumatic.  Eyes:     Extraocular Movements: Extraocular movements intact.     Conjunctiva/sclera: Conjunctivae normal.     Pupils: Pupils are equal, round, and reactive to light.  Cardiovascular:     Rate and Rhythm: Normal rate and regular rhythm.     Pulses: Normal pulses.  Pulmonary:     Effort: Pulmonary effort is normal. No respiratory distress.     Breath sounds: Rales  present. No wheezing.     Comments: LLL crackles.  Speaking in full sentences.  Sats stable on room air. Abdominal:     General: There is no distension.     Palpations: Abdomen is soft. There is no mass.     Tenderness: There is no abdominal tenderness. There is no guarding or rebound.     Comments: No ttp  Musculoskeletal:        General: Normal range of motion.     Cervical back: Normal range of motion and neck supple.     Right lower leg: No edema.     Left lower leg: No edema.  Skin:    General: Skin is warm and dry.     Capillary Refill: Capillary refill takes less than 2 seconds.  Neurological:     Mental Status: He is alert and oriented to person, place, and time.     ED Results / Procedures / Treatments   Labs (all labs ordered are listed, but only abnormal results are displayed) Labs Reviewed  CBC WITH DIFFERENTIAL/PLATELET - Abnormal; Notable for the following components:      Result Value   RBC 5.91 (*)    Hemoglobin 18.6 (*)    HCT 53.5 (*)    Platelets 149 (*)    All other components within normal limits  COMPREHENSIVE METABOLIC PANEL - Abnormal; Notable for the following components:   Glucose, Bld 111 (*)    Total Bilirubin 1.4 (*)    All other components within normal limits  RESP PANEL BY RT-PCR (FLU A&B, COVID) ARPGX2  MAGNESIUM  TSH    EKG EKG Interpretation  Date/Time:  Tuesday August 12 2020 10:11:03 EDT Ventricular Rate:  101 PR Interval:    QRS Duration: 90 QT Interval:  342 QTC Calculation: 443 R Axis:   2 Text Interpretation: Atrial fibrillation with rapid ventricular response with premature ventricular or aberrantly conducted complexes Anterior infarct , age undetermined Abnormal ECG afib vs flutter is new Confirmed by Derwood Kaplan [phone]) on 08/12/2020 11:30:52 AM   Radiology DG Chest Portable 1 View  Result Date: 08/12/2020 CLINICAL DATA:  Cough and shortness of breath.  Chills. EXAM: PORTABLE CHEST 1 VIEW COMPARISON:  06/18/2015  FINDINGS: Heart and mediastinal shadows are normal. Right lung is clear. There may be minimal atelectasis or scarring in the lingula, but otherwise the left lung is clear. No effusion is visible. Bony structures are unremarkable. IMPRESSION: Minimal atelectasis or scarring in the lingula. Otherwise no active disease. Electronically Signed   By: Paulina Fusi M.D.   On: 08/12/2020 09:53    Procedures Procedures   Medications Ordered in ED Medications  sodium chloride 0.9 % bolus 1,000 mL (0 mLs Intravenous Stopped 08/12/20 1152)  ketorolac (TORADOL) 15 MG/ML injection 15 mg (15 mg Intravenous Given 08/12/20 1019)  ED Course  I have reviewed the triage vital signs and the nursing notes.  Pertinent labs & imaging results that were available during my care of the patient were reviewed by me and considered in my medical decision making (see chart for details).    MDM Rules/Calculators/A&P     CHA2DS2-VASc Score: 0                     Patient presenting for evaluation of several day history of viral symptoms, develop dizziness today.  On exam, patient appears nontoxic.  He is not currently having any dizziness.  Most likely secondary to viral illness versus dehydration versus NyQuil/DayQuil use.  However will obtain labs to ensure no significant electrolyte abnormality, kidney dysfunction, anemia.  Will obtain EKG to look for cardiac arrhythmia.  Chest x-ray ordered due to abnormal lung sounds in the left lower lobe.  Will obtain orthostatic vital signs, and treat symptomatically.  cxr viewed and independently interpreted by me, no pna, pnx, effusion. Per radiology, some atelectasis in the left lobe.  Labs overall reassuring, although patient is slightly dehydrated.  Orthostatics negative.  COVID test is negative.  Of note, patient found to be in A. fib/flutter.  This is new.  He is not significantly tachycardic, blood pressure stable.  Will consult with cardiology.  CHA2DS2-VASc score of 0.    Discussed with Dr. Sharyn Lull from cardiology.  Recommend starting patient on metoprolol 25 mg XL and aspirin.  Patient to follow-up closely in the office on an outpatient basis.  Discussed findings and plan with patient.  Discussed the new medication she he should start, as well as warning signs to return to the ER.  At this time, patient appears safe for discharge.  Return precautions given.  Patient states he understands he and agrees to plan.   Final Clinical Impression(s) / ED Diagnoses Final diagnoses:  Atrial fibrillation by electrocardiogram [facility])  Myalgia  Chills    Rx / DC Orders ED Discharge Orders         Ordered    metoprolol succinate (TOPROL-XL) 25 MG 24 hr tablet  Daily        08/12/20 1315    aspirin 81 MG chewable tablet  Daily        08/12/20 1315    meloxicam (MOBIC) 15 MG tablet  Daily        08/12/20 1331    omeprazole (PRILOSEC) 20 MG capsule  Daily        08/12/20 1331           Jovin Fester, PA-C 08/12/20 1457    Derwood Kaplan, MD 08/13/20 [phone]

## 2020-08-12 NOTE — Discharge Instructions (Addendum)
Your EKG showed that you are in A. fib today.  This is when your heart is beating irregularly.  This will need to be followed up with the heart doctor. Call the cardiologist listed below to set up a follow-up appointment. Take aspirin daily. Start taking metoprolol daily.  If you are developing persistent dizziness/lightheadedness, especially when going from sitting to standing, stop the medicine and follow-up with the heart doctor. Return to the emergency room if you develop chest pain, difficulty breathing, persistent dizziness/lightheadedness, or any new, worsening, or concerning symptoms.

## 2020-08-12 NOTE — ED Triage Notes (Signed)
Pt arrives via POVPt reports feels "chills inside me since last Thursday". Pt denies fever, reports that he has taken tylenol with no relief and took Dayquil in the morning and nyquil at night before sleep. Pt felt he could not work today and came to ED seeking evaluation

## 2020-08-12 NOTE — ED Notes (Signed)
Pt able to ambulate on his own power, gait even and steady

## 2020-10-08 ENCOUNTER — Ambulatory Visit: Payer: 59 | Admitting: Family Medicine

## 2020-10-14 ENCOUNTER — Telehealth: Payer: Self-pay

## 2020-10-14 NOTE — Telephone Encounter (Signed)
NOTES ON FILE FROM [facility] [phone] REFERRAL SENT TO SCHEDULING

## 2020-12-11 ENCOUNTER — Institutional Professional Consult (permissible substitution): Payer: 59 | Admitting: Cardiology

## 2020-12-11 NOTE — Progress Notes (Deleted)
Electrophysiology Office Note   Date:  12/11/2020   ID:  Karon, Heckendorn 1976/01/25, MRN [id number]  PCP:  Patient, No Pcp Per (Inactive)  Cardiologist:   Primary Electrophysiologist:  Irving Lubbers Jorja Loa, MD    Chief Complaint: AF   History of Present Illness: Richard Atkinson is a 45 y.o. male who is being seen today for the evaluation of AF at the request of Norm Salt, [place]. Presenting today for electrophysiology evaluation.  He presented to the emergency room 08/12/2020 after having been sick for up to 5 days.  COVID-negative.  He was incidentally found to be in atrial fibrillation.  Today, he denies*** symptoms of palpitations, chest pain, shortness of breath, orthopnea, PND, lower extremity edema, claudication, dizziness, presyncope, syncope, bleeding, or neurologic sequela. The patient is tolerating medications without difficulties.    No past medical history on file. No past surgical history on file.   Current Outpatient Medications  Medication Sig Dispense Refill   aspirin 81 MG chewable tablet Chew 1 tablet (81 mg total) by mouth daily. 30 tablet 0   clotrimazole (JOCK ITCH) 1 % cream Apply 1 application topically 2 (two) times daily.     meloxicam (MOBIC) 15 MG tablet Take 1 tablet (15 mg total) by mouth daily. 10 tablet 0   metoprolol succinate (TOPROL-XL) 25 MG 24 hr tablet Take 1 tablet (25 mg total) by mouth daily. 30 tablet 0   omeprazole (PRILOSEC) 20 MG capsule Take 1 capsule (20 mg total) by mouth daily. 20 capsule 0   ranitidine (ZANTAC) 150 MG tablet Take 1 tablet (150 mg total) by mouth 2 (two) times daily. (Patient not taking: Reported on 06/13/2019) 60 tablet 0   SUMAtriptan (IMITREX) 50 MG tablet Take 1 tablet (50 mg total) by mouth once for 1 dose. May repeat in 2 hours if headache persists or recurs. Then take once daily until headache resolves. 10 tablet 0   No current facility-administered medications for this visit.    Allergies:   Patient  has no known allergies.   Social History:  The patient  reports that he has never smoked. He has never used smokeless tobacco. He reports that he does not drink alcohol and does not use drugs.   Family History:  The patient's ***family history is not on file.    ROS:  Please see the history of present illness.   Otherwise, review of systems is positive for none.   All other systems are reviewed and negative.    PHYSICAL EXAM: VS:  There were no vitals taken for this visit. , BMI There is no height or weight on file to calculate BMI. GEN: Well nourished, well developed, in no acute distress  HEENT: normal  Neck: no JVD, carotid bruits, or masses Cardiac: ***RRR; no murmurs, rubs, or gallops,no edema  Respiratory:  clear to auscultation bilaterally, normal work of breathing GI: soft, nontender, nondistended, + BS MS: no deformity or atrophy  Skin: warm and dry Neuro:  Strength and sensation are intact Psych: euthymic mood, full affect  EKG:  EKG {ACTION; IS/IS JQB:[id number]} ordered today. Personal review of the ekg ordered *** shows ***  Recent Labs: 08/12/2020: ALT 11; BUN 8; Creatinine, Ser 0.84; Hemoglobin 18.6; Magnesium 1.8; Platelets 149; Potassium 3.9; Sodium 135; TSH 0.525    Lipid Panel     Component Value Date/Time   CHOL 168 01/17/2014 0958   TRIG 96 01/17/2014 0958   HDL 37 (L) 01/17/2014 0958   CHOLHDL 4.5 01/17/2014  7989   VLDL 19 01/17/2014 0958   LDLCALC 112 (H) 01/17/2014 0958     Wt Readings from Last 3 Encounters:  12/06/16 234 lb 12.8 oz (106.5 kg)  11/09/16 232 lb (105.2 kg)  06/18/15 228 lb (103.4 kg)      Other studies Reviewed: Additional studies/ records that were reviewed today include: Epic notes   ASSESSMENT AND PLAN:  1.  ***Atrial fibrillation: CHA2DS2-VASc of 0.  He is both not anticoagulated.***    Current medicines are reviewed at length with the patient today.   The patient {ACTIONS; HAS/DOES NOT HAVE:19233} concerns regarding  his medicines.  The following changes were made today:  {NONE DEFAULTED:18576}  Labs/ tests ordered today include: *** No orders of the defined types were placed in this encounter.    Disposition:   FU with Dollie Mayse {gen number [id number]} {Days to years:10300}  Signed, Daundre Biel Jorja Loa, MD  12/11/2020 8:19 AM     [facility] [address] [place] [address] [phone] (office) [phone] (fax)

## 2021-01-29 ENCOUNTER — Telehealth: Payer: Self-pay

## 2021-01-29 NOTE — Telephone Encounter (Signed)
NOTES SCANNED TO REFERRAAL

## 2021-11-30 IMAGING — DX DG CHEST 1V PORT
1 series · 1 of 1 positions shown · non-contrast
Comparison: 06/18/2015

CLINICAL DATA: Cough and shortness of breath.  Chills.

EXAM:
PORTABLE CHEST 1 VIEW

[chest ap]
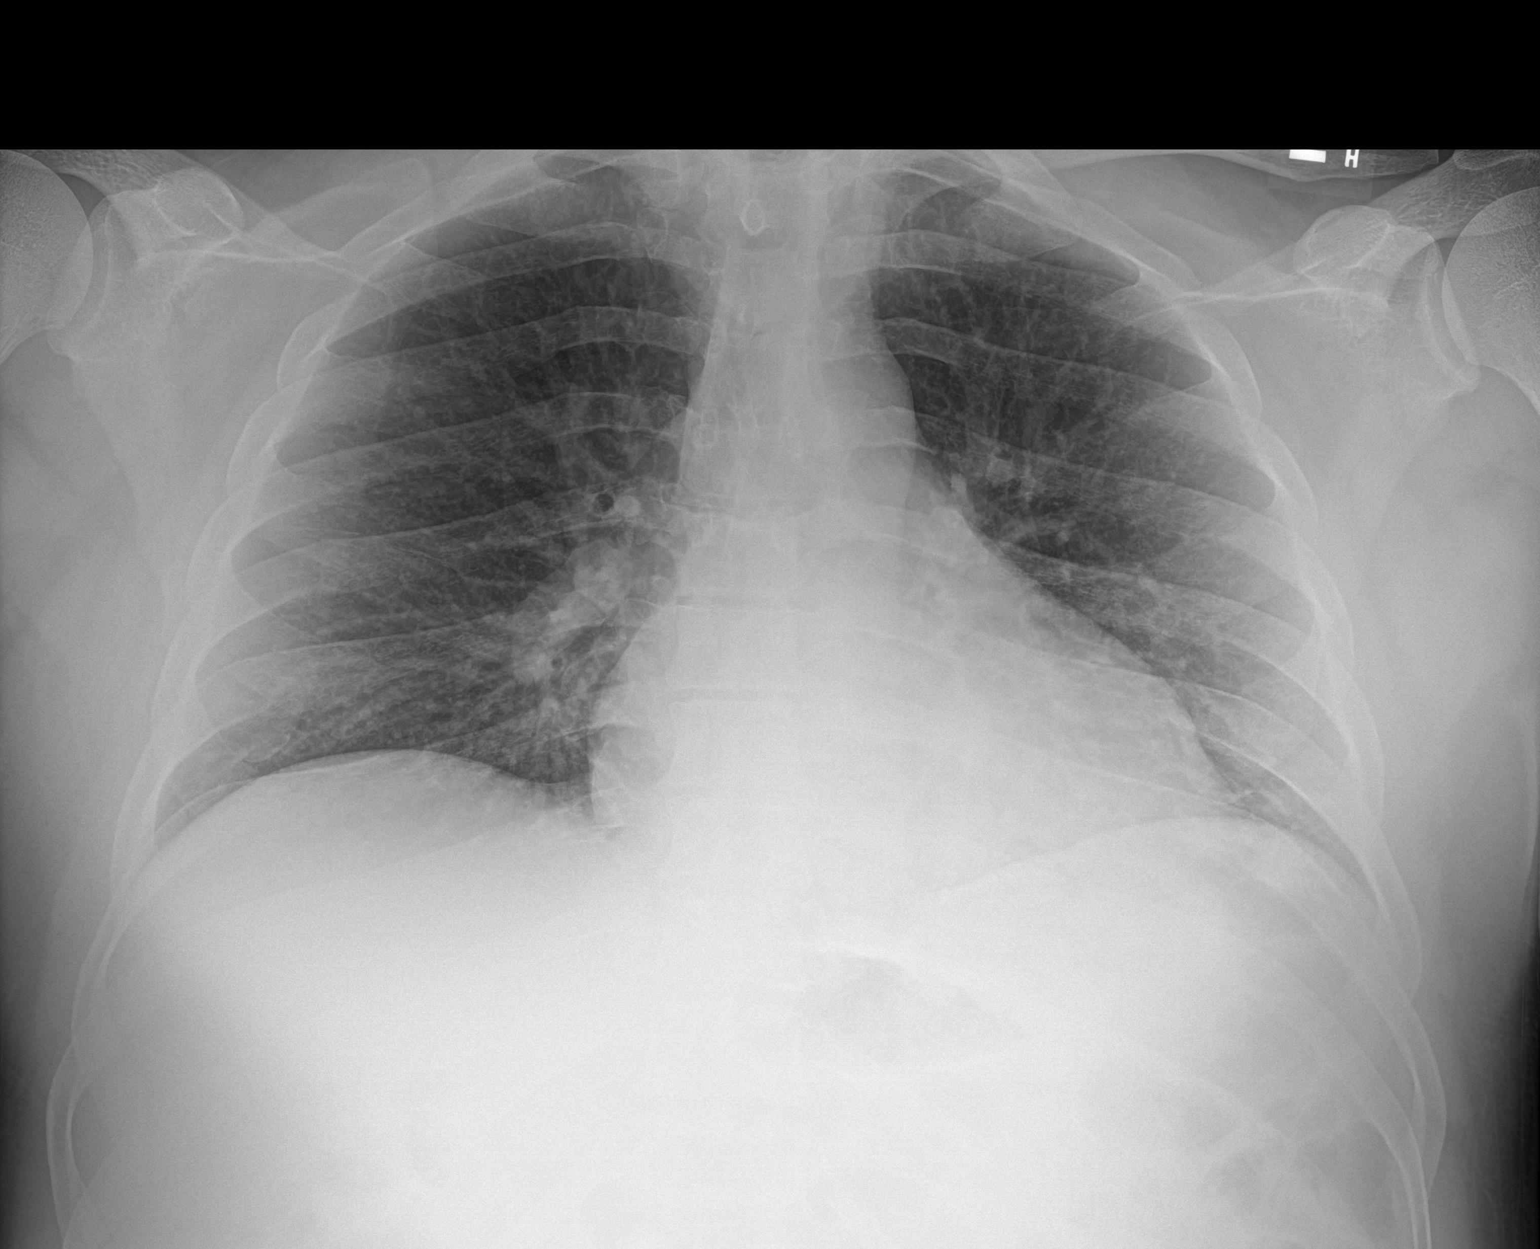

[1 of 1 positions shown; findings below may reference images not displayed]

FINDINGS: Heart and mediastinal shadows are normal. Right lung is clear. There
may be minimal atelectasis or scarring in the lingula, but otherwise
the left lung is clear. No effusion is visible. Bony structures are
unremarkable.
IMPRESSION: Minimal atelectasis or scarring in the lingula. Otherwise no active
disease.

## 2022-06-03 ENCOUNTER — Ambulatory Visit: Admit: 2022-06-03 | Discharge: 2022-06-03 | Attending: Family Medicine

## 2022-06-03 DIAGNOSIS — R1110 Vomiting, unspecified: Secondary | ICD-10-CM

## 2022-06-03 LAB — AMB POC INFLUENZA ASSAY W/OPTIC
Flu A Antigen: NEGATIVE
Flu B Antigen: NEGATIVE

## 2022-06-03 MED ORDER — ONDANSETRON 4 MG PO TBDP
4 | ORAL_TABLET | Freq: Three times a day (TID) | ORAL | 0 refills | Status: DC | PRN
Start: 2022-06-03 — End: 2024-02-23

## 2022-06-03 NOTE — Progress Notes (Signed)
Subjective:vomiting diarrhea      Patient ID: Johnny Bates     HPI The patient is a 47 y.o. male with nausea and loose stool x1 over past 24 hours some fatigue no fever no dysuria. No vomiting no new meds using otc meds no food or sick contacts.    Review of Systems: As noted in the HPI    History reviewed. No pertinent past medical history.     History reviewed. No pertinent surgical history.     No Known Allergies     Current Outpatient Medications   Medication Sig Dispense Refill    ondansetron (ZOFRAN-ODT) 4 MG disintegrating tablet Take 1 tablet by mouth 3 times daily as needed for Nausea or Vomiting 21 tablet 0     No current facility-administered medications for this visit.        BP 110/76   Pulse 71   Temp 98.2 F (36.8 C)   Resp 18   Ht 1.854 m (6' 1"$ )   Wt 103.4 kg (228 lb)   SpO2 96%   BMI 30.08 kg/m       Objective:   Physical Exam  Vitals reviewed.   Constitutional:       General: He is not in acute distress.     Appearance: Normal appearance. He is not ill-appearing, toxic-appearing or diaphoretic.   HENT:      Head: Normocephalic and atraumatic.      Right Ear: Tympanic membrane and ear canal normal.      Left Ear: Tympanic membrane and ear canal normal.      Nose: Nose normal.      Mouth/Throat:      Mouth: Mucous membranes are moist.      Pharynx: No oropharyngeal exudate or posterior oropharyngeal erythema.   Eyes:      General: No scleral icterus.        Right eye: No discharge.         Left eye: No discharge.      Extraocular Movements: Extraocular movements intact.      Conjunctiva/sclera: Conjunctivae normal.      Pupils: Pupils are equal, round, and reactive to light.   Cardiovascular:      Rate and Rhythm: Normal rate and regular rhythm.      Pulses: Normal pulses.      Heart sounds: Normal heart sounds.   Pulmonary:      Effort: Pulmonary effort is normal.      Breath sounds: Normal breath sounds.   Abdominal:      General: Abdomen is flat. Bowel sounds are normal. There is no  distension.      Palpations: Abdomen is soft. There is no mass.      Tenderness: There is no abdominal tenderness. There is no right CVA tenderness or left CVA tenderness.      Hernia: No hernia is present.   Skin:     General: Skin is warm and dry.   Neurological:      General: No focal deficit present.      Mental Status: He is alert.   Psychiatric:         Mood and Affect: Mood normal.         No scans are attached to the encounter.     Assessment:   1. Vomiting and diarrhea  -     POC Influenza Assay w/Optic FX:[id number])       Plan:     Results for orders placed or  performed in visit on 06/03/22   POC Influenza Assay w/Optic [id number])   Result Value Ref Range    Valid Internal Control, POC pass     Flu A Antigen NEGATIVE     Flu B Antigen NEGATIVE       Tx with zofran drip drop no acute abdomen, discussed diet and hydration fu as needed worsening symptoms         Leata Mouse, MD

## 2023-01-14 ENCOUNTER — Ambulatory Visit: Admit: 2023-01-14 | Discharge: 2023-01-14 | Payer: PRIVATE HEALTH INSURANCE | Attending: Orthopaedic Surgery

## 2023-01-14 VITALS — Ht 73.0 in

## 2023-01-14 DIAGNOSIS — S93402A Sprain of unspecified ligament of left ankle, initial encounter: Secondary | ICD-10-CM

## 2023-01-14 NOTE — Progress Notes (Signed)
 ORTHOPAEDICS    Name: Johnny Bates  Date of Birth: 1975/07/06  Gender: male  MRN: [id number]    CC: LEFT ANKLE (Work comp.  DOI 01/03/23/Left ankle injury, twisted ankle caught in tubing 9/16 /Employer: [employer] )       HPI:   Johnny Bates is a 47 y.o. male who presents with LEFT ANKLE (Work comp.  DOI 01/03/23/Left ankle injury, twisted ankle caught in tubing 9/16 /Employer: [employer] )    47 yo male with work related injury to left ankle on 01/03/23.  He works at [employer] and caught his foot on some tubing, twisted and fell.     Patient unfortunately has ongoing lateral ankle pain    He was diagnosed with possible calcaneus fracture          ROS/Meds/PSH/PMH/FH/SH:   I personally reviewed the patients standard intake form.  Below are the pertinents    Tobacco:  reports that he has never smoked. He has never used smokeless tobacco.  No results found for: LABA1C  No results found for: EAG   No Known Allergies   No past medical history on file.  No past surgical history on file.     Current Outpatient Medications:     ondansetron  (ZOFRAN -ODT) 4 MG disintegrating tablet, Take 1 tablet by mouth 3 times daily as needed for Nausea or Vomiting, Disp: 21 tablet, Rfl: 0   Review of Systems     Physical Examination:  Ht 1.854 m (6' 1)   BMI 30.08 kg/m      General: Well developed, well nourished and in no acute distress.  HEENT:  Head atraumatic/normocephalic.  No oropharyngeal lesions.  Neck: Trachea midline.  No thyromegaly  Eyes:  Anicteric sclerae.  Moist conjunctivae.  No lid-lag.  Extraocular muscles intact.  Psychiatric: Well oriented to person, place and time.  Demonstrates normal mood and affect.  Cardiovascular: Regular rhythm by palpation of distal pulse.  Normal color and temperature.    Pulmonary: Breathing comfortably with normal and symmetric chest expansion.    Neurologic: Cranial nerves II-XII grossly intact.  Skin: No rashes, lesions or ulcers.  Normal temperature, turgor, and texture on  uninvolved extremity.  Musculoskeletal:   LEFT ANKLE EXAM  Left foot/ankle TTP at anterior process calcaneus and over the peroneal tendons  Skin over the foot and ankle is intact.  No swelling, ecchymosis, or erythema noted.   Neutral hindfoot alignment and moderate arch height.  Negative calcaneal squeeze, negative Tinel's, no pain over the Achilles tendon .  Negative anterior drawer.  Negative Silfverskiold sign  Negative Thompson squeeze test  Resting tension equal bilaterally  Ankle range of motion 10 degrees dorsiflexion and 30 degrees plantar flexion. Inversion 25 eversion 15 degrees.  5/ 5 plantar flexion, dorsiflexion, inversion, eversion.  Pt able to perform single leg heel raise  Sensation is intact within SPN, DPN, Sural, and Tibial nerve distributions.   2+ DP and tibial pulses with brisk capillary refill      Imaging:   No image results found.       I personally reviewed 3 views of the left foot which show no acute fracture or dislocation      Assessment:     ICD-10-CM    1. Sprain of left ankle, unspecified ligament, initial encounter  S93.402A [facility] -  DURABLE MEDICAL EQUIPMENT     MRI ANKLE LEFT WO CONTRAST      2. Work related injury  Y99.0 [facility] -  DURABLE MEDICAL EQUIPMENT  MRI ANKLE LEFT WO CONTRAST           Plan:     Treatment at this time:  1. Sprain of left ankle, unspecified ligament, initial encounter  -     [facility] -  DURABLE MEDICAL EQUIPMENT  -     MRI ANKLE LEFT WO CONTRAST; Future  2. Work related injury  -     [facility] -  DURABLE MEDICAL EQUIPMENT  -     MRI ANKLE LEFT WO CONTRAST; Future       Patient sustained a work-related injury    He is having pain with weightbearing    Were going to obtain an MRI of the left ankle    We will keep him limited weightbearing until his MRI is complete

## 2023-01-17 ENCOUNTER — Ambulatory Visit: Admit: 2023-01-17 | Discharge: 2023-01-17

## 2023-01-27 DIAGNOSIS — R9389 Abnormal findings on diagnostic imaging of other specified body structures: Secondary | ICD-10-CM

## 2023-02-07 ENCOUNTER — Ambulatory Visit: Admit: 2023-02-07 | Discharge: 2023-02-07 | Payer: PRIVATE HEALTH INSURANCE | Attending: Orthopaedic Surgery

## 2023-02-07 DIAGNOSIS — R9389 Abnormal findings on diagnostic imaging of other specified body structures: Secondary | ICD-10-CM

## 2023-02-07 NOTE — Progress Notes (Signed)
ORTHOPAEDICS    Name: Johnny Bates  Date of Birth: Oct 31, 1975  Gender: male  MRN: [id number]    CC: Follow-up (WORKERS COMP - MRI F/R LT ANKLE WC Date of injury: 01/03/2023 Sprain of left ankle, unspecified ligament/)       HPI:   Johnny Bates is a 47 y.o. male who presents with Follow-up (WORKERS COMP - MRI F/R LT ANKLE WC Date of injury: 01/03/2023 Sprain of left ankle, unspecified ligament/)    47 yo male with work related injury to left ankle on 01/03/23.  He works at [employer] and caught his foot on some tubing, twisted and fell.     Patient unfortunately has ongoing lateral ankle pain    He was diagnosed with possible calcaneus fracture    02/07/23  Patient presents for evaluation.  He is here to review recent MRI    I personally reviewed the MRI with him and his wife which shows evidence of of possible 5 x 4 x 2 mm fracture of anterior process calcaneus    Mild to moderate ankle sprain    Mild bone bruise and possible healing nondisplaced impaction injury of the inferior aspect of anterior process        ROS/Meds/PSH/PMH/FH/SH:   I personally reviewed the patients standard intake form.  Below are the pertinents    Tobacco:  reports that he has never smoked. He has never used smokeless tobacco.  No results found for: "LABA1C"  No results found for: "EAG"   No Known Allergies   History reviewed. No pertinent past medical history.  History reviewed. No pertinent surgical history.     Current Outpatient Medications:     ondansetron (ZOFRAN-ODT) 4 MG disintegrating tablet, Take 1 tablet by mouth 3 times daily as needed for Nausea or Vomiting, Disp: 21 tablet, Rfl: 0   Review of Systems     Physical Examination:  Ht 1.854 m (6\' 1" )   Wt 103.4 kg (228 lb)   BMI 30.08 kg/m      General: Well developed, well nourished and in no acute distress.  HEENT:  Head atraumatic/normocephalic.  No oropharyngeal lesions.  Neck: Trachea midline.  No thyromegaly  Eyes:  Anicteric sclerae.  Moist conjunctivae.  No  lid-lag.  Extraocular muscles intact.  Psychiatric: Well oriented to person, place and time.  Demonstrates normal mood and affect.  Cardiovascular: Regular rhythm by palpation of distal pulse.  Normal color and temperature.    Pulmonary: Breathing comfortably with normal and symmetric chest expansion.    Neurologic: Cranial nerves II-XII grossly intact.  Skin: No rashes, lesions or ulcers.  Normal temperature, turgor, and texture on uninvolved extremity.  Musculoskeletal:   LEFT ANKLE EXAM  Left foot/ankle TTP at anterior process calcaneus and over the peroneal tendons  Skin over the foot and ankle is intact.  No swelling, ecchymosis, or erythema noted.   Neutral hindfoot alignment and moderate arch height.  Negative calcaneal squeeze, negative Tinel's, no pain over the Achilles tendon .  Negative anterior drawer.  Negative Silfverskiold sign  Negative Thompson squeeze test  Resting tension equal bilaterally  Ankle range of motion 10 degrees dorsiflexion and 30 degrees plantar flexion. Inversion 25 eversion 15 degrees.  5/ 5 plantar flexion, dorsiflexion, inversion, eversion.  Pt able to perform single leg heel raise  Sensation is intact within SPN, DPN, Sural, and Tibial nerve distributions.   2+ DP and tibial pulses with brisk capillary refill      Imaging:   No image results  found.       I personally reviewed 3 views of the left foot which show no acute fracture or dislocation      Assessment:     ICD-10-CM    1. Abnormal MRI  R93.89            Plan:     Treatment at this time:  1. Abnormal MRI  Overview:  LT ANKLE MRI 01/26/2023 [facility]  IMPRESSION:    1. Recent moderate lateral and mild medial ankle sprain. Recent partial  disruption of the sinus tarsi.    2. Recent mild to moderate sprain of the bifurcate ligament.    3. Possible 5 x 4 x 2 mm fracture of the anterior process the calcaneus  (sagittal #12). Recommend correlation with plain films or if warranted  noncontrast CT.    4. Mild bone bruise and  possible healing nondisplaced impaction injury of  the inferior aspect of the anterior process of the talus (sagittal #7).    5. Low grade plantar fasciitis.      Today we reviewed the MRI    Based on the findings, I feel is reasonable for him to start weightbearing in the boot    We will send him to physical therapy and have him transition out of the boot over the next 4 to 6 weeks    He can work light duty with no prolonged standing    32 min were spent on this encounter

## 2023-02-10 NOTE — Telephone Encounter (Signed)
SWP and informed him we do not have a close-toed boot option and therefore he would need to remain out of work until his f/u on 11/21.    Informed patient I would send a copy to: [email or web address]    Patient expressed gratitude for call.

## 2023-02-10 NOTE — Telephone Encounter (Addendum)
FYI PREVIOUS TE WITH EMAIL WAS INCORRECT, THIS IS THE CORRECT EMAIL, NEED IT TO BE SENT TODAY    [email or web address]     PLEASE CALL PATIENT AFTER IT IS EMAILED

## 2023-02-10 NOTE — Telephone Encounter (Signed)
Patient called he needs to replace the boot that he has due to it being open toe. His manager wants him to have a close boot for work. He just started working light duty.  Please call patient for instruction

## 2023-02-10 NOTE — Telephone Encounter (Signed)
Work note emailed to address provided below.

## 2023-02-15 NOTE — Telephone Encounter (Signed)
Morrie Sheldon at [employer] is requesting a return call. She has a few questions regarding the patient.

## 2023-02-15 NOTE — Telephone Encounter (Signed)
WB status of patient has been clarified with [employer]. They may take Serenity out of the boot for therapy. Otherwise, he should be WBAT in the boot.

## 2023-02-16 NOTE — Telephone Encounter (Signed)
Spoke with patient and explained that [employer] called and stated that they were able to accommodate the patient based off of the restrictions from 10/21

## 2023-02-16 NOTE — Telephone Encounter (Signed)
PATIENT IS ASKING IF RECENT WORK NOTES WERE SENT TO WORK COMP ([employer]) TO INCLUDE THAT HE IS OUT OF WORK UNTIL NEXT VISIT ON 03/07/23. PLEASE CALL. THANK YOU.

## 2023-02-23 NOTE — Telephone Encounter (Signed)
SW Morrie Sheldon and confirmed it is fine for patient to perform work exercises such as lifting boxes.

## 2023-02-23 NOTE — Telephone Encounter (Signed)
Morrie Sheldon at [employer] is requesting a return call regarding the patient.

## 2023-03-07 ENCOUNTER — Ambulatory Visit
Admit: 2023-03-07 | Discharge: 2023-03-07 | Payer: PRIVATE HEALTH INSURANCE | Attending: Orthopaedic Surgery | Admitting: Orthopaedic Surgery

## 2023-03-07 VITALS — Ht 73.0 in | Wt 228.0 lb

## 2023-03-07 DIAGNOSIS — S92022A Displaced fracture of anterior process of left calcaneus, initial encounter for closed fracture: Principal | ICD-10-CM

## 2023-03-07 NOTE — Progress Notes (Signed)
 ORTHOPAEDICS    Name: Johnny Bates  Date of Birth: 1976/03/06  Gender: male  MRN: [id number]    CC: Follow-up (WORKERS COMP - WC Date of injury: 01/03/2023 (~8 WKS) Sprain of left ankle, unspecified ligamen TX: LIGHT DUTY, NO PROLONGED STANDIN

## 2023-03-10 ENCOUNTER — Encounter

## 2023-04-04 ENCOUNTER — Ambulatory Visit
Admit: 2023-04-04 | Discharge: 2023-04-04 | Payer: PRIVATE HEALTH INSURANCE | Attending: Orthopaedic Surgery | Admitting: Orthopaedic Surgery

## 2023-04-04 VITALS — Ht 73.0 in | Wt 228.0 lb

## 2023-04-04 DIAGNOSIS — S92022A Displaced fracture of anterior process of left calcaneus, initial encounter for closed fracture: Secondary | ICD-10-CM

## 2023-04-04 NOTE — Progress Notes (Signed)
 ORTHOPAEDICS    Name: Johnny Bates  Date of Birth: 1975-07-28  Gender: male  MRN: [id number]    CC: Follow-up [facility] LEFT ANKLE INJURY 3 MONTH FOLLOW UP DOI: 01/03/2023 Sprain of left ankle//Pt states his ankle is doing better and has no pain

## 2024-01-26 NOTE — Telephone Encounter (Signed)
"  Called and lvm reminding pt of new pt appointment on Monday 10/13 @ 3:00  "

## 2024-02-14 ENCOUNTER — Encounter

## 2024-02-23 ENCOUNTER — Ambulatory Visit: Admit: 2024-02-23 | Discharge: 2024-02-23

## 2024-02-23 ENCOUNTER — Encounter

## 2024-02-23 MED ORDER — TADALAFIL 20 MG PO TABS
20 | ORAL_TABLET | Freq: Every day | ORAL | 5 refills | Status: AC | PRN
Start: 2024-02-23 — End: ?

## 2024-02-23 NOTE — Progress Notes (Signed)
 "Johnny Bates (DOB:  Jan 25, 1976) is a 48 y.o. male here for evaluation of the following chief complaint(s):  Establish Care    SUBJECTIVE:  Wants a check up, and having some ED  No loss of libido, unable to maintain erections, no problem with orgasm or ejaculation  No hx of medical problems    Review of Systems   Constitutional:  Negative for chills, fever and unexpected weight change.   HENT:  Negative for congestion.    Eyes:  Negative for pain.   Respiratory:  Negative for cough and shortness of breath.    Cardiovascular:  Negative for chest pain and palpitations.   Gastrointestinal:  Negative for abdominal pain, constipation, diarrhea and vomiting.   Endocrine: Negative for cold intolerance, heat intolerance, polydipsia, polyphagia and polyuria.   Genitourinary:  Negative for difficulty urinating, dysuria, frequency and hematuria.   Musculoskeletal:  Negative for joint swelling and myalgias.   Skin:  Negative for rash.   Neurological:  Negative for dizziness and headaches.   Psychiatric/Behavioral:  Negative for agitation. The patient is not nervous/anxious.      See HPI for details    History reviewed. No pertinent past medical history.   History reviewed. No pertinent family history.   Social History     Socioeconomic History    Marital status: Married     Spouse name: Not on file    Number of children: Not on file    Years of education: Not on file    Highest education level: Not on file   Occupational History    Not on file   Tobacco Use    Smoking status: Never    Smokeless tobacco: Never   Vaping Use    Vaping status: Never Used   Substance and Sexual Activity    Alcohol use: Never    Drug use: Never    Sexual activity: Not on file   Other Topics Concern    Not on file   Social History Narrative    Not on file     Social Drivers of Health     Financial Resource Strain: Not on file   Food Insecurity: Not on file   Transportation Needs: Not on file   Physical Activity: Not on file   Stress: Not on file    Social Connections: Not on file   Intimate Partner Violence: Not At Risk (04/09/2023)    Received from [facility]     Abuse Screen     Feels Unsafe at Home or Work/School: no     Feels Threatened by Someone: no     Does Anyone Try to Keep You From Having Contact with Others or Doing Things Outside Your Home?: no     Physical Signs of Abuse Present: no   Housing Stability: Not on file      History reviewed. No pertinent surgical history.     OBJECTIVE:  Physical Exam  Constitutional:       Appearance: Normal appearance.   HENT:      Head: Normocephalic and atraumatic.      Nose: Nose normal.   Eyes:      Extraocular Movements: Extraocular movements intact.      Conjunctiva/sclera: Conjunctivae normal.      Pupils: Pupils are equal, round, and reactive to light.   Cardiovascular:      Rate and Rhythm: Normal rate. Rhythm irregular.      Pulses: Normal pulses.      Heart sounds:  Normal heart sounds.      Comments: Occasional PVCs  Pulmonary:      Effort: Pulmonary effort is normal.      Breath sounds: Normal breath sounds. No wheezing.   Abdominal:      General: Abdomen is flat. Bowel sounds are normal.      Palpations: Abdomen is soft.      Tenderness: There is no abdominal tenderness.   Genitourinary:     Penis: Normal.       Testes: Normal.   Musculoskeletal:         General: Normal range of motion.      Cervical back: Normal range of motion and neck supple.   Skin:     General: Skin is warm and dry.   Neurological:      General: No focal deficit present.      Mental Status: He is alert and oriented to person, place, and time.   Psychiatric:         Mood and Affect: Mood normal.         Behavior: Behavior normal.       BP 129/70 (BP Site: Left Upper Arm, Patient Position: Sitting, BP Cuff Size: Large Adult)   Pulse 66   Temp 98.3 F (36.8 C) (Tympanic)   Resp 20   Ht 1.854 m (6' 1)   Wt 112.9 kg (249 lb)   SpO2 97%   BMI 32.85 kg/m       No results found for any visits on  02/23/24.  ASSESSMENT/PLAN:  1. Erectile dysfunction, unspecified erectile dysfunction type  -     Testosterone; Future  -     CBC with Auto Differential; Future  -     Comprehensive Metabolic Panel; Future  -     Lipid Panel; Future  -     TSH reflex to FT4; Future      Return if symptoms worsen or fail to improve.    An electronic signature was used to authenticate this note.  Florette LELON Shams, MD        "

## 2024-02-24 LAB — TESTOSTERONE: Testosterone: 407 ng/dL (ref 249.0–836.0)

## 2024-02-24 LAB — CBC WITH AUTO DIFFERENTIAL
Basophils %: 0.2 % (ref 0.0–2.0)
Basophils Absolute: 0 x10e3/mcL (ref 0.0–0.2)
Eosinophils %: 1.4 % (ref 0.0–7.0)
Eosinophils Absolute: 0.1 x10e3/mcL (ref 0.0–0.5)
Hematocrit: 48.4 % (ref 38.0–52.0)
Hemoglobin: 17.1 g/dL (ref 13.0–17.3)
Immature Grans (Abs): 0.02 x10e3/mcL (ref 0.00–0.06)
Immature Granulocytes %: 0.2 % (ref 0.0–0.6)
Lymphocytes Absolute: 2.6 x10e3/mcL (ref 1.0–3.2)
Lymphocytes: 29.9 % (ref 15.0–45.0)
MCH: 32.1 pg (ref 27.0–34.5)
MCHC: 35.3 g/dL (ref 30.0–36.0)
MCV: 91 fL (ref 84.0–100.0)
MPV: 9.9 fL (ref 7.0–12.2)
Monocytes %: 8.4 % (ref 4.0–12.0)
Monocytes Absolute: 0.7 x10e3/mcL (ref 0.3–1.0)
NRBC Absolute: 0 x10e3/mcL (ref 0.000–0.012)
NRBC Automated: 0 % (ref 0.0–0.2)
Neutrophils %: 59.9 % (ref 42.0–74.0)
Neutrophils Absolute: 5.1 x10e3/mcL (ref 1.6–7.3)
Platelets: 222 x10e3/mcL (ref 140–440)
RBC: 5.32 x10e6/mcL (ref 4.00–5.60)
RDW: 12.3 % (ref 10.0–17.0)
WBC: 8.6 x10e3/mcL (ref 3.8–10.6)

## 2024-02-24 LAB — COMPREHENSIVE METABOLIC PANEL
ALT: 8 U/L (ref 0–42)
AST: 26 U/L (ref 0–46)
Albumin/Globulin Ratio: 1.7 (ref 1.00–2.70)
Albumin: 4.3 g/dL (ref 3.5–5.2)
Alk Phosphatase: 63 U/L (ref 40–130)
Anion Gap: 7 mmol/L (ref 2–17)
BUN: 12 mg/dL (ref 6–20)
CO2: 30 mmol/L — ABNORMAL HIGH (ref 22–29)
Calcium: 8.9 mg/dL (ref 8.5–10.7)
Chloride: 101 mmol/L (ref 98–107)
Creatinine: 0.9 mg/dL (ref 0.7–1.3)
Est, Glom Filt Rate: 107 mL/min/1.73mÂ² (ref >=60)
Globulin: 2.6 g/dL (ref 1.9–4.4)
Glucose: 99 mg/dL (ref 70–99)
Osmolaliy Calculated: 275 mosm/kg (ref 270–287)
Potassium: 4.1 mmol/L (ref 3.5–5.3)
Sodium: 138 mmol/L (ref 135–145)
Total Bilirubin: 0.7 mg/dL (ref 0.00–1.20)
Total Protein: 6.9 g/dL (ref 5.7–8.3)

## 2024-02-24 LAB — LIPID PANEL
Chol/HDL Ratio: 4.4 (ref 0.0–4.4)
Cholesterol, Total: 158 mg/dL (ref 100–200)
HDL: 36 mg/dL — ABNORMAL LOW (ref >=40)
LDL Cholesterol: 107.4 mg/dL — ABNORMAL HIGH (ref 0.0–100.0)
LDL/HDL Ratio: 3
Triglycerides: 73 mg/dL (ref 0–149)
VLDL: 14.6 mg/dL (ref 5.0–40.0)

## 2024-02-24 LAB — TSH REFLEX TO FT4: TSH: 2.15 u[IU]/mL (ref 0.358–3.740)

## 2024-05-14 ENCOUNTER — Ambulatory Visit: Admit: 2024-05-14 | Discharge: 2024-05-14 | Payer: BLUE CROSS/BLUE SHIELD

## 2024-05-14 VITALS — BP 110/80 | HR 65 | Temp 100.50000°F | Resp 17 | Wt 246.2 lb

## 2024-05-14 DIAGNOSIS — J101 Influenza due to other identified influenza virus with other respiratory manifestations: Principal | ICD-10-CM

## 2024-05-14 LAB — AMB POC COVID-19 & INFLUENZA A/B
INFLUENZA A RNA, POC: DETECTED — AB
INFLUENZA B RNA, POC: NOT DETECTED
SARS-COV-2 RNA, POC: NEGATIVE

## 2024-05-14 MED ORDER — OSELTAMIVIR PHOSPHATE 75 MG PO CAPS
75 | ORAL_CAPSULE | Freq: Two times a day (BID) | ORAL | 0 refills | Status: AC
Start: 2024-05-14 — End: 2024-05-19

## 2024-05-14 NOTE — Progress Notes (Signed)
 Johnny Bates (DOB:  [birth year]) is a 49 y.o. male here for evaluation of the following chief complaint(s):  Other (X 3 days ago -  coughing , running nose , body , chills)    SUBJECTIVE:  About 3 days of chills, body aches, runny nose, coughing, denies SOB, CP, chest tightness.      Review of Systems  See HPI for details    History reviewed. No pertinent past medical history.   History reviewed. No pertinent family history.   Social History     Socioeconomic History    Marital status: Married     Spouse name: Not on file    Number of children: Not on file    Years of education: Not on file    Highest education level: Not on file   Occupational History    Not on file   Tobacco Use    Smoking status: Never    Smokeless tobacco: Never   Vaping Use    Vaping status: Never Used   Substance and Sexual Activity    Alcohol use: Never    Drug use: Never    Sexual activity: Not on file   Other Topics Concern    Not on file   Social History Narrative    Not on file     Social Drivers of Health     Financial Resource Strain: Not on file   Food Insecurity: Not on file   Transportation Needs: Not on file   Physical Activity: Not on file   Stress: Not on file   Social Connections: Not on file   Intimate Partner Violence: Not At Risk (04/09/2023)    Received from [facility]     Abuse Screen     Feels Unsafe at Home or Work/School: no     Feels Threatened by Someone: no     Does Anyone Try to Keep You From Having Contact with Others or Doing Things Outside Your Home?: no     Physical Signs of Abuse Present: no   Housing Stability: Not on file      History reviewed. No pertinent surgical history.     OBJECTIVE:  Physical Exam  Constitutional:       Appearance: Normal appearance.   HENT:      Head: Normocephalic and atraumatic.      Right Ear: Tympanic membrane normal.      Left Ear: Tympanic membrane normal.      Nose: Congestion and rhinorrhea present.      Mouth/Throat:      Mouth: Mucous membranes are  moist.      Pharynx: Posterior oropharyngeal erythema present.   Eyes:      Extraocular Movements: Extraocular movements intact.      Conjunctiva/sclera: Conjunctivae normal.      Pupils: Pupils are equal, round, and reactive to light.   Cardiovascular:      Rate and Rhythm: Normal rate and regular rhythm.      Pulses: Normal pulses.      Heart sounds: Normal heart sounds.   Pulmonary:      Effort: Pulmonary effort is normal.      Breath sounds: Normal breath sounds. No wheezing.   Musculoskeletal:      Cervical back: Normal range of motion and neck supple.   Skin:     General: Skin is warm and dry.   Neurological:      General: No focal deficit present.      Mental Status:  He is alert and oriented to person, place, and time.       BP 110/80   Pulse 65   Temp (!) 100.5 F (38.1 C) (Oral)   Resp 17   Wt 111.7 kg (246 lb 3.2 oz)   SpO2 96%   BMI 32.48 kg/m     Results for orders placed or performed in visit on 05/14/24   AMB POC COVID-19 & INFLUENZA A/B   Result Value Ref Range    SARS-COV-2 RNA, POC Negative     INFLUENZA A RNA, POC Detected (A)     INFLUENZA B RNA, POC Not-Detected     Internal Control      Lot number swab      EXP date swab      LOT NUMBER POC      EXPIRATION DATE      Lot number solution      EXP date solution       ASSESSMENT/PLAN:  1. Influenza A  -     AMB POC COVID-19 & INFLUENZA A/B  -     oseltamivir  (TAMIFLU ) 75 MG capsule; Take 1 capsule by mouth 2 times daily for 5 days, Disp-10 capsule, R-0Normal  Could alternate ibuprofen and Tylenol every 4 hours to help control fever as well as to control body aches.  Drink a lot of water and continue to wash his hands frequently and keep his mouth covered when coughing around his family.  I need to be out of work until he has had no fevers for 24 hours.    Return if symptoms worsen or fail to improve.    An electronic signature was used to authenticate this note.  Florette LELON Shams, MD
